# Patient Record
Sex: Female | Born: 2000 | Race: White | Hispanic: No | Marital: Single | Smoking: Never smoker
Health system: Southern US, Community
[De-identification: ages and names within clinical notes are randomized; demographics above are authoritative.]

## PROBLEM LIST (undated history)

## (undated) DIAGNOSIS — F32A Depression, unspecified: Secondary | ICD-10-CM

## (undated) DIAGNOSIS — F419 Anxiety disorder, unspecified: Secondary | ICD-10-CM

## (undated) DIAGNOSIS — G43909 Migraine, unspecified, not intractable, without status migrainosus: Secondary | ICD-10-CM

## (undated) HISTORY — DX: Anxiety disorder, unspecified: F41.9

## (undated) HISTORY — DX: Depression, unspecified: F32.A

---
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
## 2021-01-24 ENCOUNTER — Encounter: Payer: Self-pay | Admitting: Pediatrics

## 2021-01-24 ENCOUNTER — Other Ambulatory Visit: Payer: Self-pay

## 2021-01-24 ENCOUNTER — Ambulatory Visit (INDEPENDENT_AMBULATORY_CARE_PROVIDER_SITE_OTHER): Payer: Medicaid Other | Admitting: Pediatrics

## 2021-01-24 DIAGNOSIS — Z23 Encounter for immunization: Secondary | ICD-10-CM | POA: Diagnosis not present

## 2021-01-24 NOTE — Progress Notes (Signed)
   Chief Complaint  Patient presents with   Immunizations    Accompanied by self     Orders Placed This Encounter  Procedures   Meningococcal B, OMV (Bexsero)     Diagnosis:  Encounter for Vaccines (Z23) Handout (VIS) provided for each vaccine at this visit. Questions were answered. Parent verbally expressed understanding and also agreed with the administration of vaccine/vaccines as ordered above today.

## 2021-02-23 ENCOUNTER — Ambulatory Visit
Admission: EM | Admit: 2021-02-23 | Discharge: 2021-02-23 | Disposition: A | Discharge status: Home / Self Care | Payer: Medicaid Other | Attending: Internal Medicine | Admitting: Internal Medicine

## 2021-02-23 ENCOUNTER — Other Ambulatory Visit: Payer: Self-pay

## 2021-02-23 ENCOUNTER — Encounter: Payer: Self-pay | Admitting: Emergency Medicine

## 2021-02-23 DIAGNOSIS — J209 Acute bronchitis, unspecified: Secondary | ICD-10-CM | POA: Diagnosis not present

## 2021-02-23 MED ORDER — BENZONATATE 100 MG PO CAPS: 100.0000 mg | ORAL_CAPSULE | Freq: Three times a day (TID) | ORAL | 0 refills | Status: DC

## 2021-02-23 MED ORDER — CETIRIZINE HCL 10 MG PO TABS: 10.0000 mg | ORAL_TABLET | Freq: Every day | ORAL | Status: DC

## 2021-02-23 MED ORDER — ALBUTEROL SULFATE HFA 108 (90 BASE) MCG/ACT IN AERS: 1.0000 | INHALATION_SPRAY | Freq: Four times a day (QID) | RESPIRATORY_TRACT | 0 refills | Status: DC | PRN

## 2021-02-23 NOTE — ED Provider Notes (Signed)
[facility]    CSN: [id number] Arrival date & time: 02/23/21  0805      History   Chief Complaint Chief Complaint  Patient presents with   Cough   Sore Throat    HPI Emily [name] is a 20 y.o. female comes to the urgent care with a 2-week history of sore throat, and a cough productive of greenish sputum.  Patient says symptoms started 2 weeks ago and has been persistent.  Sore throat is worse in the mornings.  She admits having some postnasal drip.  She has had some headaches, nausea and fatigue.  She denies any vomiting or diarrhea.  Patient had some family members who tested positive for COVID couple of weeks ago.  She tested negative using home COVID test kit.  No shortness of breath.  She has some wheezing intermittently.  No chest pain or chest pressure.  Patient denies any seasonal allergies.  She is complaining of some itchy eyes over the past couple weeks.  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 No pertinent surgical history.  OB History   No obstetric history on file.      Home Medications    Prior to Admission medications   Medication Sig Start Date End Date Taking? Authorizing Provider  albuterol (VENTOLIN HFA) 108 (90 Base) MCG/ACT inhaler Inhale 1-2 puffs into the lungs every 6 (six) hours as needed for wheezing or shortness of breath. 02/23/21  Yes Jerrol Helmers, Myrene Galas, MD  benzonatate (TESSALON) 100 MG capsule Take 1 capsule (100 mg total) by mouth every 8 (eight) hours. 02/23/21  Yes Eugean Arnott, Myrene Galas, MD  cetirizine (ZYRTEC ALLERGY) 10 MG tablet Take 1 tablet (10 mg total) by mouth daily. 02/23/21  Yes Karianna Gusman,  Myrene Galas, MD  escitalopram (LEXAPRO) 10 MG tablet TAKE (1) TABLET BY MOUTH AT BEDTIME. 10/26/20  Yes Salvador, Vivian, DO  Multiple Vitamins-Minerals (MULTIVITAMIN ADULTS PO) Take by mouth.   Yes [provider]  Vitamin D, Ergocalciferol, (DRISDOL) 1.25 MG (50000 UNIT) CAPS capsule Take 1 capsule (50,000 Units total) by mouth every 7 (seven) days. 10/26/20   Iven Finn, DO    Family History History reviewed. No pertinent family history.  Social History Social History   Tobacco Use   Smoking status: Passive Smoke Exposure - Never Smoker   Smokeless tobacco: Never  Vaping Use   Vaping Use: Never used  Substance Use Topics   Alcohol use: Never   Drug use: Never     Allergies   Patient has no known allergies.   Review of Systems Review of Systems As per HPI  Physical Exam Triage Vital Signs ED Triage Vitals  Enc Vitals Group     BP 02/23/21 0814 128/85     Pulse Rate 02/23/21 0814 (!) 106     Resp 02/23/21 0814 18     Temp 02/23/21 0814 98.2 F (36.8 C)     Temp Source 02/23/21 0814 Oral     SpO2 02/23/21 0814 97 %     Weight --      Height --  Head Circumference --      Peak Flow --      Pain Score 02/23/21 0812 4     Pain Loc --      Pain Edu? --      Excl. in [place]? --    No data found.  Updated Vital Signs BP 128/85 (BP Location: Right Arm)   Pulse (!) 106   Temp 98.2 F (36.8 C) (Oral)   Resp 18   LMP 02/22/2021 (Exact Date)   SpO2 97%   Visual Acuity Right Eye Distance:   Left Eye Distance:   Bilateral Distance:    Right Eye Near:   Left Eye Near:    Bilateral Near:     Physical Exam Vitals and nursing note reviewed.  Constitutional:      General: She is not in acute distress.    Appearance: She is not ill-appearing.  HENT:     Right Ear: Tympanic membrane normal.     Left Ear: Tympanic membrane normal.     Nose: Congestion present. No rhinorrhea.     Mouth/Throat:     Mouth: Mucous membranes are moist.     Pharynx: No  posterior oropharyngeal erythema.  Cardiovascular:     Rate and Rhythm: Normal rate and regular rhythm.  Pulmonary:     Effort: Pulmonary effort is normal.     Breath sounds: Normal breath sounds. No wheezing or rhonchi.  Neurological:     Mental Status: She is alert.     UC Treatments / Results  Labs (all labs ordered are listed, but only abnormal results are displayed) Labs Reviewed - No data to display  EKG   Radiology No results found.  Procedures Procedures (including critical care time)  Medications Ordered in UC Medications - No data to display  Initial Impression / Assessment and Plan / UC Course  I have reviewed the triage vital signs and the nursing notes.  Pertinent labs & imaging results that were available during my care of the patient were reviewed by me and considered in my medical decision making (see chart for details).     1.  Acute bronchitis with bronchospasm: Albuterol inhaler as needed for wheezing Tessalon Perles as needed for cough Cetirizine as needed for itchy eyes Return to urgent care if symptoms worsen Your lung exam was unimpressive-no indication for chest x-ray at this time. Final Clinical Impressions(s) / UC Diagnoses   Final diagnoses:  Acute bronchitis with bronchospasm     Discharge Instructions      Please take medications as prescribed If symptoms worsen please return to urgent care to be reevaluated.   ED Prescriptions     Medication Sig Dispense Auth. Provider   albuterol (VENTOLIN HFA) 108 (90 Base) MCG/ACT inhaler Inhale 1-2 puffs into the lungs every 6 (six) hours as needed for wheezing or shortness of breath. 18 g Melquisedec Journey, Myrene Galas, MD   benzonatate (TESSALON) 100 MG capsule Take 1 capsule (100 mg total) by mouth every 8 (eight) hours. 21 capsule Thersea Manfredonia, Myrene Galas, MD   benzonatate (TESSALON) 100 MG capsule  (Status: Discontinued) Take 1 capsule (100 mg total) by mouth every 8 (eight) hours. 21 capsule Andrae Claunch,  Myrene Galas, MD   cetirizine (ZYRTEC ALLERGY) 10 MG tablet Take 1 tablet (10 mg total) by mouth daily. -- Chase Picket, MD      PDMP not reviewed this encounter.   Chase Picket, MD 02/23/21 [phone]

## 2021-02-23 NOTE — Discharge Instructions (Addendum)
Please take medications as prescribed If symptoms worsen please return to urgent care to be reevaluated.

## 2021-02-23 NOTE — ED Triage Notes (Signed)
Patient c/o productive cough w/ "green" sputum and sore throat x 2 weeks.   Patient endorses worsening sore throat in the morning, patient states " it's better some days and worst other days".   Patient denies fever at home.   Patient endorses nasal congestion, headache, nausea, and fatigue.   Patient reports taking an at home COVID test with negative results.   Patient has taken Mucinex w/ some relief of coughing.

## 2021-05-28 DIAGNOSIS — E669 Obesity, unspecified: Secondary | ICD-10-CM | POA: Diagnosis not present

## 2021-05-28 DIAGNOSIS — Z0000 Encounter for general adult medical examination without abnormal findings: Secondary | ICD-10-CM | POA: Diagnosis not present

## 2021-06-09 ENCOUNTER — Other Ambulatory Visit: Payer: Self-pay

## 2021-06-09 ENCOUNTER — Encounter: Payer: Self-pay | Admitting: *Deleted

## 2021-06-09 ENCOUNTER — Ambulatory Visit
Admission: EM | Admit: 2021-06-09 | Discharge: 2021-06-09 | Disposition: A | Discharge status: Home / Self Care | Payer: Medicaid Other | Attending: Family Medicine | Admitting: Family Medicine

## 2021-06-09 DIAGNOSIS — R112 Nausea with vomiting, unspecified: Secondary | ICD-10-CM

## 2021-06-09 DIAGNOSIS — G43009 Migraine without aura, not intractable, without status migrainosus: Secondary | ICD-10-CM

## 2021-06-09 HISTORY — DX: Migraine, unspecified, not intractable, without status migrainosus: G43.909

## 2021-06-09 MED ORDER — ONDANSETRON 4 MG PO TBDP: 4.0000 mg | ORAL_TABLET | Freq: Three times a day (TID) | ORAL | 0 refills | Status: DC | PRN

## 2021-06-09 MED ORDER — DEXAMETHASONE SODIUM PHOSPHATE 10 MG/ML IJ SOLN
10.0000 mg | Freq: Once | INTRAMUSCULAR | Status: AC
  Administered 2021-06-09: 16:00:00 10 mg via INTRAMUSCULAR

## 2021-06-09 MED ORDER — SUMATRIPTAN SUCCINATE 50 MG PO TABS: ORAL_TABLET | ORAL | 0 refills | Status: DC

## 2021-06-09 NOTE — ED Triage Notes (Signed)
Pt states started with her "typical" migraine this AM upon waking.  Has taken OTC acetaminophen/caffeine/ASA med this AM without any improvement. C/O sensitivity to light and smell (states normal for her migraines), nausea.

## 2021-06-13 NOTE — ED Provider Notes (Signed)
[facility]    CSN: [id number] Arrival date & time: 06/09/21  1401      History   Chief Complaint Chief Complaint  Patient presents with   Migraine    HPI Emily [name] is a 20 y.o. female.   Patient presenting today with new onset migraine since waking this morning.  States that she gets migraines frequently and this 1 is very typical for her.  Light sensitivity, smell sensitivity, nausea but no vomiting.  Has tried her typical over-the-counter acetaminophen and Excedrin with no relief.  States she has been dealing with migraines for quite some time, typically injectable medications help more than over-the-counter.  Denies dizziness, head injury, fever, chills.   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 No pertinent surgical history.  OB History   No obstetric history on file.      Home Medications    Prior to Admission medications   Medication Sig Start Date End Date Taking? Authorizing Provider  escitalopram (LEXAPRO) 10 MG tablet TAKE (1) TABLET BY MOUTH AT BEDTIME. 10/26/20  Yes Salvador, Vivian, DO  Multiple Vitamins-Minerals (MULTIVITAMIN ADULTS PO) Take by mouth.   Yes [provider]  ondansetron (ZOFRAN-ODT) 4 MG disintegrating tablet Take 1 tablet (4 mg total) by mouth every 8 (eight) hours as needed for nausea or vomiting. 06/09/21  Yes Particia Nearing, PA-C  SUMAtriptan (IMITREX) 50 MG tablet Take 1 tab at onset of migraine. May repeat in 2 hours if headache persists or recurs. Max of 2 tabs daily 06/09/21  Yes Particia Nearing, PA-C  Vitamin D, Ergocalciferol, (DRISDOL) 1.25  MG (50000 UNIT) CAPS capsule Take 1 capsule (50,000 Units total) by mouth every 7 (seven) days. 10/26/20  Yes Salvador, Vivian, DO  albuterol (VENTOLIN HFA) 108 (90 Base) MCG/ACT inhaler Inhale 1-2 puffs into the lungs every 6 (six) hours as needed for wheezing or shortness of breath. 02/23/21   Lamptey, Britta Mccreedy, MD  benzonatate (TESSALON) 100 MG capsule Take 1 capsule (100 mg total) by mouth every 8 (eight) hours. 02/23/21   Merrilee Jansky, MD  cetirizine (ZYRTEC ALLERGY) 10 MG tablet Take 1 tablet (10 mg total) by mouth daily. 02/23/21   Lamptey, Britta Mccreedy, MD    Family History Family History  Problem Relation Age of Onset   Healthy Mother    Healthy Father     Social History Social History   Tobacco Use   Smoking status: Never    Passive exposure: Yes   Smokeless tobacco: Never  Vaping Use   Vaping Use: Never used  Substance Use Topics   Alcohol use: Never   Drug use: Never     Allergies   Patient has no known allergies.   Review of Systems Review of Systems Per HPI  Physical Exam Triage Vital Signs ED Triage Vitals [06/09/21 1529]  Enc Vitals Group     BP 129/86     Pulse Rate 95     Resp 16     Temp 97.8 F (36.6 C)     Temp Source Temporal  SpO2 98 %     Weight      Height      Head Circumference      Peak Flow      Pain Score 10     Pain Loc      Pain Edu?      Excl. in [place]?    No data found.  Updated Vital Signs BP 129/86    Pulse 95    Temp 97.8 F (36.6 C) (Temporal)    Resp 16    LMP 06/02/2021 (Approximate)    SpO2 98%   Visual Acuity Right Eye Distance:   Left Eye Distance:   Bilateral Distance:    Right Eye Near:   Left Eye Near:    Bilateral Near:     Physical Exam Vitals and nursing note reviewed.  Constitutional:      Appearance: Normal appearance. She is not ill-appearing.  HENT:     Head: Atraumatic.     Mouth/Throat:     Mouth: Mucous membranes are moist.  Eyes:     Extraocular Movements: Extraocular movements intact.      Conjunctiva/sclera: Conjunctivae normal.  Cardiovascular:     Rate and Rhythm: Normal rate and regular rhythm.     Heart sounds: Normal heart sounds.  Pulmonary:     Effort: Pulmonary effort is normal.     Breath sounds: Normal breath sounds.  Abdominal:     General: Bowel sounds are normal. There is no distension.     Palpations: Abdomen is soft.     Tenderness: There is no abdominal tenderness. There is no guarding.  Musculoskeletal:        General: Normal range of motion.     Cervical back: Normal range of motion and neck supple.  Skin:    General: Skin is warm and dry.  Neurological:     General: No focal deficit present.     Mental Status: She is alert and oriented to person, place, and time.     Motor: No weakness.     Gait: Gait normal.  Psychiatric:        Mood and Affect: Mood normal.        Thought Content: Thought content normal.        Judgment: Judgment normal.     UC Treatments / Results  Labs (all labs ordered are listed, but only abnormal results are displayed) Labs Reviewed - No data to display  EKG   Radiology No results found.  Procedures Procedures (including critical care time)  Medications Ordered in UC Medications  dexamethasone (DECADRON) injection 10 mg (10 mg Intramuscular Given 06/09/21 1559)    Initial Impression / Assessment and Plan / UC Course  I have reviewed the triage vital signs and the nursing notes.  Pertinent labs & imaging results that were available during my care of the patient were reviewed by me and considered in my medical decision making (see chart for details).     Vital signs reassuring, no focal neurologic deficits on exam.  IM Decadron given in addition to continued over-the-counter pain relievers, rest, hydration.  We will also send Zofran and Imitrex to try as needed.  PCP follow-up recommended.  Return for acutely worsening symptoms.  Final Clinical Impressions(s) / UC Diagnoses   Final diagnoses:   Migraine without aura and without status migrainosus, not intractable  Nausea and vomiting, unspecified vomiting type   Discharge Instructions   None    ED Prescriptions  Medication Sig Dispense Auth. Provider   ondansetron (ZOFRAN-ODT) 4 MG disintegrating tablet Take 1 tablet (4 mg total) by mouth every 8 (eight) hours as needed for nausea or vomiting. 20 tablet Particia Nearing, [place]   SUMAtriptan (IMITREX) 50 MG tablet Take 1 tab at onset of migraine. May repeat in 2 hours if headache persists or recurs. Max of 2 tabs daily 10 tablet Particia Nearing, [place]      PDMP not reviewed this encounter.   Particia Nearing, [place] 06/13/21 [phone]

## 2021-07-17 DIAGNOSIS — Z30017 Encounter for initial prescription of implantable subdermal contraceptive: Secondary | ICD-10-CM | POA: Diagnosis not present

## 2021-08-09 IMAGING — US US BREAST*L* LIMITED INC AXILLA
1 series · 8 of 8 positions shown · non-contrast
Comparison: None.

CLINICAL DATA: 19-year-old female with a palpable area of concern
in the far outer left breast.

EXAM:
ULTRASOUND OF THE LEFT BREAST

[Series 1: us breast*left* limited inc axilla · 0.07mm/px · 8 of 8 slices shown]
[im 1/8]
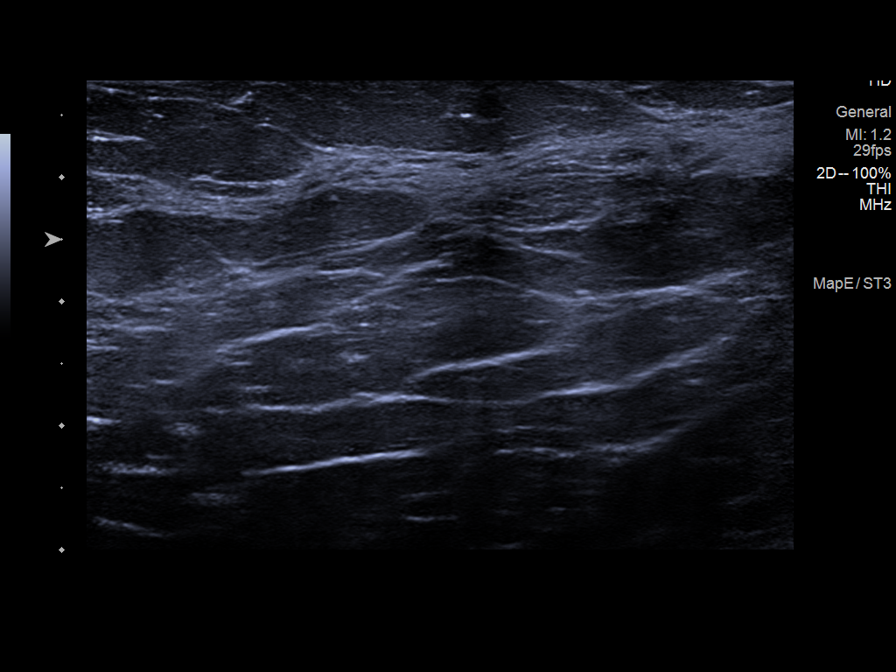
[im 2/8]
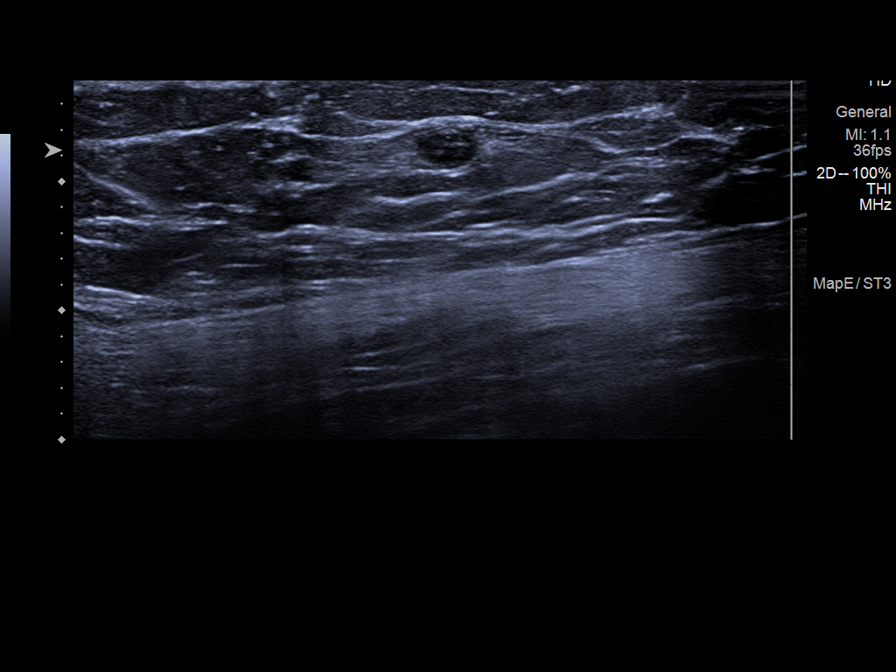
[im 3/8]
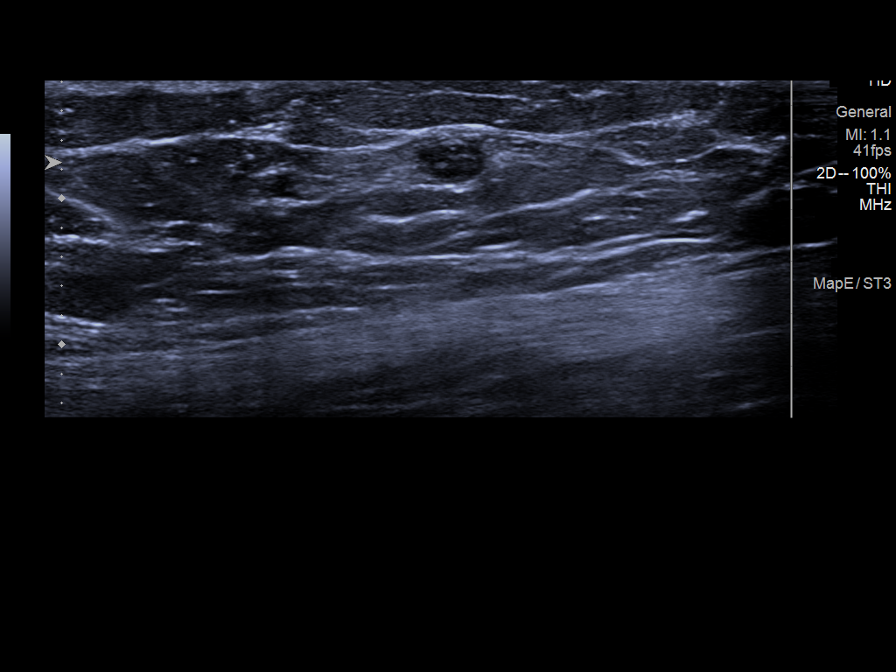
[im 4/8]
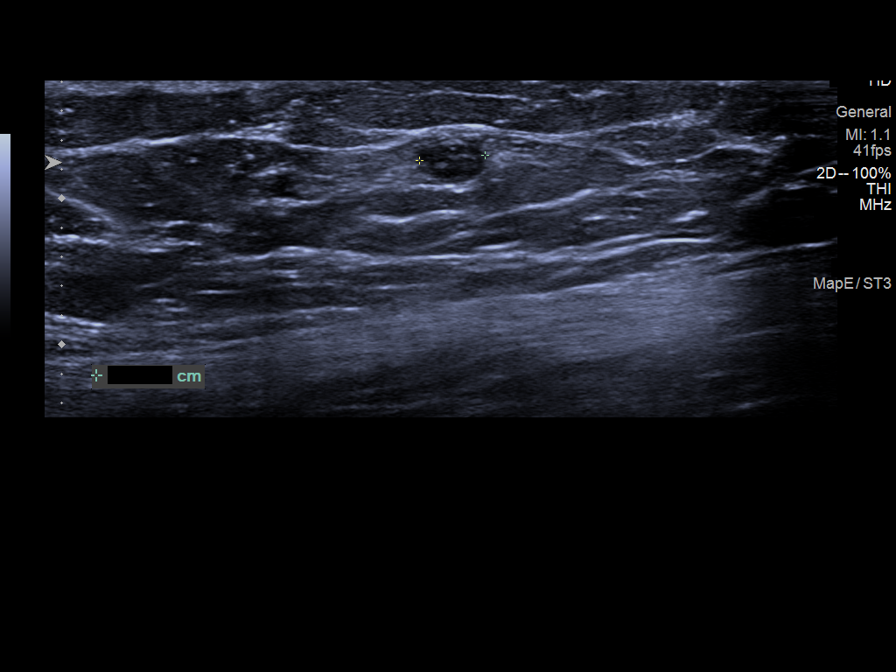
[im 5/8]
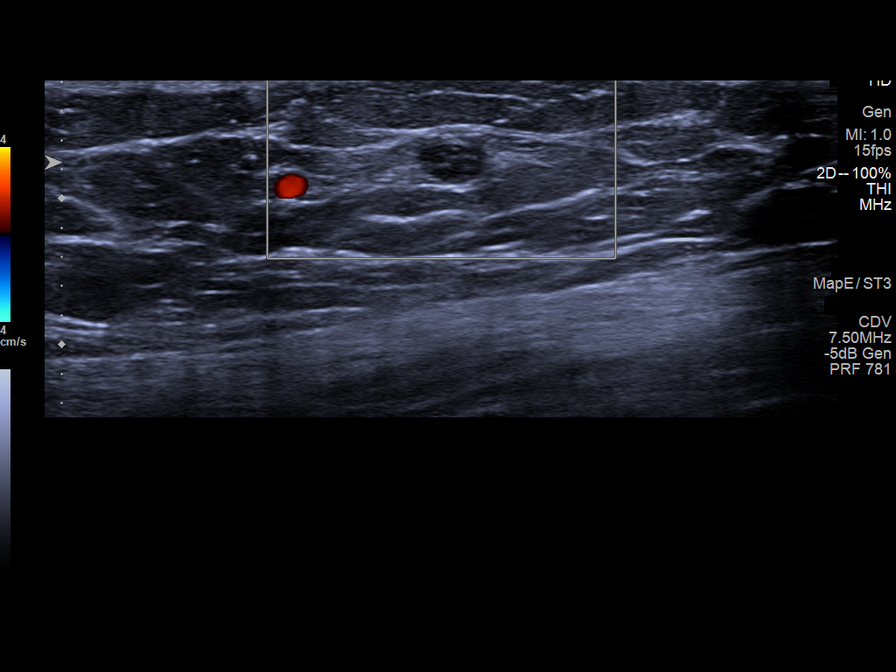
[im 6/8]
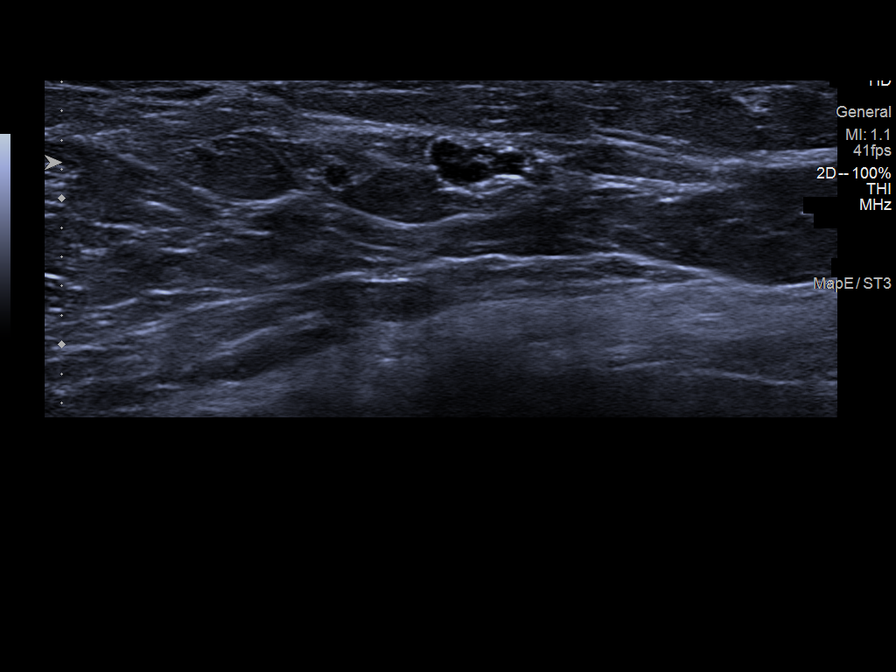
[im 7/8]
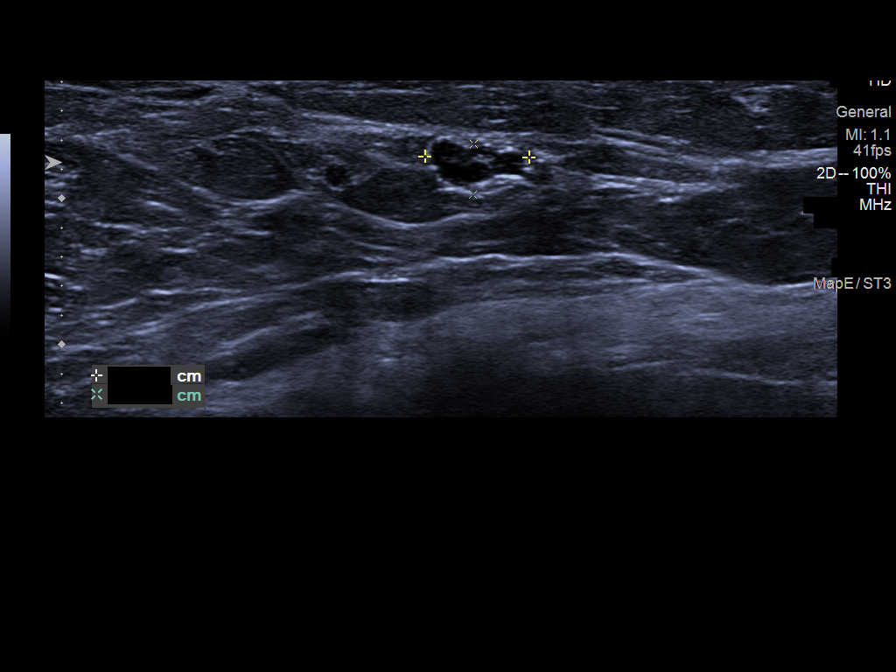
[im 8/8]
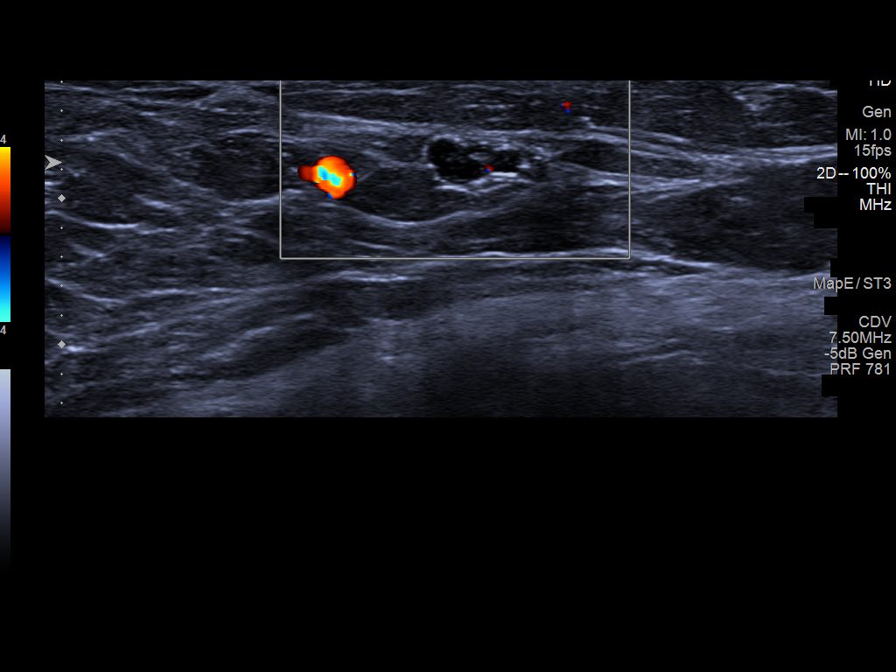

[8 of 8 positions shown; findings below may reference images not displayed]

FINDINGS: Physical examination of the far outer left breast reveals a small
slightly mobile palpable mass.

Targeted ultrasound of the left breast was performed. There is a
small intramammary lymph node at 1 o'clock 11 cm from nipple
measuring 0.5 x 0.4 x 0.7 cm. No suspicious masses or abnormality
seen in the region of concern in the outer left breast.
IMPRESSION: Benign appearing intramammary lymph node at site of palpable concern
in the outer left breast.

RECOMMENDATION:
Screening mammogram at age 41 unless there are persistent or
intervening clinical concerns. (Code:11-B-0S5)

I have discussed the findings and recommendations with the patient.
If applicable, a reminder letter will be sent to the patient
regarding the next appointment.

BI-RADS CATEGORY  2: Benign.

## 2021-08-29 ENCOUNTER — Encounter: Payer: Self-pay | Admitting: Internal Medicine

## 2021-08-29 ENCOUNTER — Other Ambulatory Visit: Payer: Self-pay

## 2021-08-29 ENCOUNTER — Ambulatory Visit (INDEPENDENT_AMBULATORY_CARE_PROVIDER_SITE_OTHER): Payer: Medicaid Other | Admitting: Internal Medicine

## 2021-08-29 VITALS — BP 138/88 | HR 102 | Resp 16 | Ht 66.0 in | Wt 279.0 lb

## 2021-08-29 DIAGNOSIS — G43719 Chronic migraine without aura, intractable, without status migrainosus: Secondary | ICD-10-CM | POA: Insufficient documentation

## 2021-08-29 DIAGNOSIS — J302 Other seasonal allergic rhinitis: Secondary | ICD-10-CM | POA: Diagnosis not present

## 2021-08-29 DIAGNOSIS — F411 Generalized anxiety disorder: Secondary | ICD-10-CM | POA: Diagnosis not present

## 2021-08-29 DIAGNOSIS — N926 Irregular menstruation, unspecified: Secondary | ICD-10-CM | POA: Insufficient documentation

## 2021-08-29 MED ORDER — ESCITALOPRAM OXALATE 10 MG PO TABS: 10.0000 mg | ORAL_TABLET | Freq: Every day | ORAL | 3 refills | Status: DC

## 2021-08-29 MED ORDER — RIZATRIPTAN BENZOATE 10 MG PO TABS: 10.0000 mg | ORAL_TABLET | ORAL | 2 refills | Status: DC | PRN

## 2021-08-29 NOTE — Assessment & Plan Note (Signed)
Likely due to recent Nexplanon insertion ?Advised to contact OB/GYN if persistent concern ?

## 2021-08-29 NOTE — Assessment & Plan Note (Signed)
Advised to follow low-carb diet and perform moderate exercise at least 150 minutes/week 

## 2021-08-29 NOTE — Assessment & Plan Note (Signed)
Was given Imitrex, but did not help much ?Will give a trial of Maxalt ?If she requires frequent use of Maxalt, will start prophylactic treatment ?

## 2021-08-29 NOTE — Assessment & Plan Note (Signed)
Well controlled with Claritin currently ?

## 2021-08-29 NOTE — Patient Instructions (Signed)
Please start taking Maxalt as needed for migraine. ? ?Please continue to take Lexapro as prescribed. ? ?Please take Vitamin D 2000 IU once daily in addition to multivitamin supplement. ?

## 2021-08-29 NOTE — Assessment & Plan Note (Signed)
Well controlled with Lexapro currently, refilled 

## 2021-08-29 NOTE — Progress Notes (Signed)
? ?New Patient Office Visit ? ?Subjective:  ?Patient ID: Emily [name], female    DOB: [birth year]  Age: 21 y.o. MRN: [id number] ? ?CC:  ?Chief Complaint  ?Patient presents with  ? New Patient (Initial Visit)  ?  New patient was being seen at [facility]. Pt has been having bad migraines since she was a child and would like to have this check also may need refills on anxiety meds  ? ? ?HPI ?Emily [name] is a 21 y.o. female with past medical history of GAD, migraine, morbid obesity and seasonal allergies who presents for establishing care. ? ?GAD: Currently well controlled with Lexapro.  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  She appears to have social anxiety as well.  Her mother is present during the visit today.  She currently denies any anhedonia, SI or HI. ? ?She also complains of intermittent headache, which is unilateral, lasts for few hours to a day and is associated with photophobia.  Her headache is triggered by certain smell and light.  She denies watery eyes. ? ?She takes Claritin for allergies. ? ?She complains of irregular menstrual cycles since she has had Nexplanon.  She has seen [facility] and had Nexplanon placed in 01/23.  She denies any vaginal discharge currently.  Denies any dysuria or hematuria currently. ? ?She has not had COVID or flu vaccines. ? ?Past Medical History:  ?Diagnosis Date  ? Anxiety   ? Phreesia 03/29/2020  ? Depression   ? Phreesia 03/29/2020  ? Febrile seizure ([facility]) 12/2003  ? Migraines   ? UTI (urinary tract infection) 12/2003  ? Renal US WNL  ? ? ?History reviewed. No pertinent surgical history. ? ?Family History  ?Problem Relation Age of Onset  ? Healthy Mother   ? Healthy Father   ? ? ?Social History  ? ?Socioeconomic History  ? Marital status: Single  ?  Spouse name: Not on file  ? Number of children: Not on file  ? Years of education: Not on file  ? Highest education level: Not on file  ?Occupational History  ? Not on file  ?Tobacco Use  ?  Smoking status: Never  ?  Passive exposure: Yes  ? Smokeless tobacco: Never  ?Vaping Use  ? Vaping Use: Never used  ?Substance and Sexual Activity  ? Alcohol use: Never  ? Drug use: Never  ? Sexual activity: Never  ?  Partners: Female  ?Other Topics Concern  ? Not on file  ?Social History Narrative  ? Not on file  ? ?Social Determinants of Health  ? ?Financial Resource Strain: Not on file  ?Food Insecurity: Not on file  ?Transportation Needs: Not on file  ?Physical Activity: Not on file  ?Stress: Not on file  ?Social Connections: Not on file  ?Intimate Partner Violence: Not on file  ? ? ?ROS ?Review of Systems  ?Constitutional:  Negative for chills and fever.  ?HENT:  Negative for congestion, sinus pressure, sinus pain and sore throat.   ?Eyes:  Negative for pain and discharge.  ?Respiratory:  Negative for cough and shortness of breath.   ?Cardiovascular:  Negative for chest pain and palpitations.  ?Gastrointestinal:  Negative for abdominal pain, constipation, diarrhea, nausea and vomiting.  ?Endocrine: Negative for polydipsia and polyuria.  ?Genitourinary:  Positive for menstrual problem. Negative for dysuria and hematuria.  ?Musculoskeletal:  Negative for neck pain and neck stiffness.  ?Skin:  Negative for rash.  ?Neurological:  Positive for headaches. Negative for  dizziness and weakness.  ?Psychiatric/Behavioral:  Negative for agitation, behavioral problems and dysphoric mood. The patient is nervous/anxious.   ? ?Objective:  ? ?Today's Vitals: BP 138/88 (BP Location: Left Arm, Patient Position: Sitting, Cuff Size: Normal)   Pulse (!) 102   Resp 16   Ht 5\' 6"  (1.676 m)   Wt 279 lb (126.6 kg)   SpO2 100%   BMI 45.03 kg/m?  ? ?Physical Exam ?Vitals reviewed.  ?Constitutional:   ?   General: She is not in acute distress. ?   Appearance: She is obese. She is not diaphoretic.  ?HENT:  ?   Head: Normocephalic and atraumatic.  ?   Nose: Nose normal.  ?   Mouth/Throat:  ?   Mouth: Mucous membranes are moist.  ?Eyes:   ?   General: No scleral icterus. ?   Extraocular Movements: Extraocular movements intact.  ?Cardiovascular:  ?   Rate and Rhythm: Normal rate and regular rhythm.  ?   Pulses: Normal pulses.  ?   Heart sounds: Normal heart sounds. No murmur heard. ?Pulmonary:  ?   Breath sounds: Normal breath sounds. No wheezing or rales.  ?Abdominal:  ?   Palpations: Abdomen is soft.  ?   Tenderness: There is no abdominal tenderness.  ?Musculoskeletal:  ?   Cervical back: Neck supple. No tenderness.  ?   Right lower leg: No edema.  ?   Left lower leg: No edema.  ?Skin: ?   General: Skin is warm.  ?   Findings: No rash.  ?Neurological:  ?   General: No focal deficit present.  ?   Mental Status: She is alert and oriented to person, place, and time.  ?Psychiatric:     ?   Mood and Affect: Mood is anxious.     ?   Behavior: Behavior normal.  ? ? ?Assessment & Plan:  ? ?Problem List Items Addressed This Visit   ? ?  ? Cardiovascular and Mediastinum  ? Intractable chronic migraine without aura and without status migrainosus - Primary  ?  Was given Imitrex, but did not help much ?Will give a trial of Maxalt ?If she requires frequent use of Maxalt, will start prophylactic treatment ?  ?  ? Relevant Medications  ? rizatriptan (MAXALT) 10 MG tablet  ? escitalopram (LEXAPRO) 10 MG tablet  ?  ? Other  ? Generalized anxiety disorder  ?  Well controlled with Lexapro currently, refilled ?  ?  ? Relevant Medications  ? escitalopram (LEXAPRO) 10 MG tablet  ? Morbid obesity (HCC)  ?  Advised to follow low-carb diet and perform moderate exercise at least 150 minutes/week ?  ?  ? Irregular menstrual bleeding  ?  Likely due to recent Nexplanon insertion ?Advised to contact OB/GYN if persistent concern ?  ?  ? Seasonal allergies  ?  Well controlled with Claritin currently ?  ?  ? ? ?Outpatient Encounter Medications as of 08/29/2021  ?Medication Sig  ? loratadine (CLARITIN) 10 MG tablet Take 10 mg by mouth daily.  ? Multiple Vitamins-Minerals (MULTIVITAMIN  ADULTS PO) Take by mouth.  ? NEXPLANON 68 MG IMPL implant   ? rizatriptan (MAXALT) 10 MG tablet Take 1 tablet (10 mg total) by mouth as needed for migraine. May repeat in 2 hours if needed. Maximum 2 per day.  ? [DISCONTINUED] escitalopram (LEXAPRO) 10 MG tablet TAKE (1) TABLET BY MOUTH AT BEDTIME.  ? escitalopram (LEXAPRO) 10 MG tablet Take 1 tablet (10 mg total) by  mouth at bedtime.  ? [DISCONTINUED] albuterol (VENTOLIN HFA) 108 (90 Base) MCG/ACT inhaler Inhale 1-2 puffs into the lungs every 6 (six) hours as needed for wheezing or shortness of breath. (Patient not taking: Reported on 08/29/2021)  ? [DISCONTINUED] benzonatate (TESSALON) 100 MG capsule Take 1 capsule (100 mg total) by mouth every 8 (eight) hours. (Patient not taking: Reported on 08/29/2021)  ? [DISCONTINUED] cetirizine (ZYRTEC ALLERGY) 10 MG tablet Take 1 tablet (10 mg total) by mouth daily. (Patient not taking: Reported on 08/29/2021)  ? [DISCONTINUED] ondansetron (ZOFRAN-ODT) 4 MG disintegrating tablet Take 1 tablet (4 mg total) by mouth every 8 (eight) hours as needed for nausea or vomiting. (Patient not taking: Reported on 08/29/2021)  ? [DISCONTINUED] SUMAtriptan (IMITREX) 50 MG tablet Take 1 tab at onset of migraine. May repeat in 2 hours if headache persists or recurs. Max of 2 tabs daily (Patient not taking: Reported on 08/29/2021)  ? [DISCONTINUED] Vitamin D, Ergocalciferol, (DRISDOL) 1.25 MG (50000 UNIT) CAPS capsule Take 1 capsule (50,000 Units total) by mouth every 7 (seven) days. (Patient not taking: Reported on 08/29/2021)  ? ?No facility-administered encounter medications on file as of 08/29/2021.  ? ? ?Follow-up: Return in about 6 months (around 03/01/2022) for Annual physical.  ? ?Anabel Halon, MD ?

## 2021-10-07 ENCOUNTER — Ambulatory Visit
Admission: EM | Admit: 2021-10-07 | Discharge: 2021-10-07 | Disposition: A | Discharge status: Home / Self Care | Payer: Medicaid Other | Attending: Family Medicine | Admitting: Family Medicine

## 2021-10-07 DIAGNOSIS — R112 Nausea with vomiting, unspecified: Secondary | ICD-10-CM | POA: Diagnosis not present

## 2021-10-07 DIAGNOSIS — R21 Rash and other nonspecific skin eruption: Secondary | ICD-10-CM

## 2021-10-07 DIAGNOSIS — R197 Diarrhea, unspecified: Secondary | ICD-10-CM | POA: Diagnosis not present

## 2021-10-07 MED ORDER — PREDNISONE 20 MG PO TABS: 40.0000 mg | ORAL_TABLET | Freq: Every day | ORAL | 0 refills | Status: DC

## 2021-10-07 NOTE — ED Provider Notes (Signed)
?[facility] ? ? ? ?CSN: [id number] ?Arrival date & time: 10/07/21  1026 ? ? ?  ? ?History   ?Chief Complaint ?Chief Complaint  ?Patient presents with  ? Diarrhea  ? Emesis  ? Rash  ? ? ?HPI ?Emily [name] is a 21 y.o. female.  ? ?Presenting today with 3-day history of nausea, vomiting, diarrhea that is now resolving well and she is tolerating p.o.  States now she is developing a red diffuse rash on her face.  She states she gets this from time to time but never to this extent, usually just around the eyes and eyelid area.  Denies itching, pain, new soaps or hygiene products, new foods, new medications, new outdoor exposures.  She is not having any chest tightness, shortness of breath, throat swelling or itching, nausea, vomiting, fevers.  Not trying anything for the rash thus far.  She states it usually goes away on its own. ? ?Past Medical History:  ?Diagnosis Date  ? Anxiety   ? Phreesia 03/29/2020  ? Depression   ? Phreesia 03/29/2020  ? Febrile seizure (HCC) 12/2003  ? Migraines   ? UTI (urinary tract infection) 12/2003  ? Renal US WNL  ? ?Patient Active Problem List  ? Diagnosis Date Noted  ? Intractable chronic migraine without aura and without status migrainosus 08/29/2021  ? Generalized anxiety disorder 08/29/2021  ? Morbid obesity (HCC) 08/29/2021  ? Irregular menstrual bleeding 08/29/2021  ? Seasonal allergies 08/29/2021  ? ?History reviewed. No pertinent surgical history. ? ?OB History   ?No obstetric history on file. ?  ? ?Home Medications   ? ?Prior to Admission medications   ?Medication Sig Start Date End Date Taking? Authorizing Provider  ?predniSONE (DELTASONE) 20 MG tablet Take 2 tablets (40 mg total) by mouth daily with breakfast. 10/07/21  Yes Particia NearingLane, Jahniyah Revere Elizabeth, PA-C  ?escitalopram (LEXAPRO) 10 MG tablet Take 1 tablet (10 mg total) by mouth at bedtime. 08/29/21   Anabel HalonPatel, Rutwik K, MD  ?loratadine (CLARITIN) 10 MG tablet Take 10 mg by mouth daily.    [provider]  ?Multiple  Vitamins-Minerals (MULTIVITAMIN ADULTS PO) Take by mouth.    [provider]  ?NEXPLANON 68 MG IMPL implant  06/04/21   [provider]  ?rizatriptan (MAXALT) 10 MG tablet Take 1 tablet (10 mg total) by mouth as needed for migraine. May repeat in 2 hours if needed. Maximum 2 per day. 08/29/21   Anabel HalonPatel, Rutwik K, MD  ? ?Family History ?Family History  ?Problem Relation Age of Onset  ? Healthy Mother   ? Healthy Father   ? ?Social History ?Social History  ? ?Tobacco Use  ? Smoking status: Never  ?  Passive exposure: Yes  ? Smokeless tobacco: Never  ?Vaping Use  ? Vaping Use: Never used  ?Substance Use Topics  ? Alcohol use: Never  ? Drug use: Never  ? ? ? ?Allergies   ?Patient has no known allergies. ? ? ?Review of Systems ?Review of Systems ?Per HPI ? ?Physical Exam ?Triage Vital Signs ?ED Triage Vitals  ?Enc Vitals Group  ?   BP 10/07/21 1115 115/75  ?   Pulse Rate 10/07/21 1115 93  ?   Resp 10/07/21 1115 18  ?   Temp 10/07/21 1115 98.6 ?F (37 ?C)  ?   Temp src --   ?   SpO2 10/07/21 1115 99 %  ?   Weight --   ?   Height --   ?  Head Circumference --   ?   Peak Flow --   ?   Pain Score 10/07/21 1113 0  ?   Pain Loc --   ?   Pain Edu? --   ?   Excl. in GC? --   ? ?No data found. ? ?Updated Vital Signs ?BP 115/75   Pulse 93   Temp 98.6 ?F (37 ?C)   Resp 18   SpO2 99%  ? ?Visual Acuity ?Right Eye Distance:   ?Left Eye Distance:   ?Bilateral Distance:   ? ?Right Eye Near:   ?Left Eye Near:    ?Bilateral Near:    ? ?Physical Exam ?Vitals and nursing note reviewed.  ?Constitutional:   ?   Appearance: Normal appearance. She is not ill-appearing.  ?HENT:  ?   Head: Atraumatic.  ?   Mouth/Throat:  ?   Mouth: Mucous membranes are moist.  ?   Pharynx: No oropharyngeal exudate or posterior oropharyngeal erythema.  ?Eyes:  ?   Extraocular Movements: Extraocular movements intact.  ?   Conjunctiva/sclera: Conjunctivae normal.  ?Cardiovascular:  ?   Rate and Rhythm: Normal rate and regular rhythm.  ?   Heart  sounds: Normal heart sounds.  ?Pulmonary:  ?   Effort: Pulmonary effort is normal.  ?   Breath sounds: Normal breath sounds. No wheezing or rales.  ?Abdominal:  ?   General: Bowel sounds are normal. There is no distension.  ?   Palpations: Abdomen is soft.  ?   Tenderness: There is no abdominal tenderness. There is no guarding.  ?Musculoskeletal:     ?   General: Normal range of motion.  ?   Cervical back: Normal range of motion and neck supple.  ?Skin: ?   General: Skin is warm and dry.  ?   Findings: Rash present.  ?   Comments: Diffuse erythematous rash to face extending to lower chin  ?Neurological:  ?   Mental Status: She is alert and oriented to person, place, and time.  ?Psychiatric:     ?   Mood and Affect: Mood normal.     ?   Thought Content: Thought content normal.     ?   Judgment: Judgment normal.  ? ? ? ?UC Treatments / Results  ?Labs ?(all labs ordered are listed, but only abnormal results are displayed) ?Labs Reviewed - No data to display ? ?EKG ? ? ?Radiology ?No results found. ? ?Procedures ?Procedures (including critical care time) ? ?Medications Ordered in UC ?Medications - No data to display ? ?Initial Impression / Assessment and Plan / UC Course  ?I have reviewed the triage vital signs and the nursing notes. ? ?Pertinent labs & imaging results that were available during my care of the patient were reviewed by me and considered in my medical decision making (see chart for details). ? ?  ? ?Vitals and exam very reassuring today, patient states her GI symptoms have all but resolved and no red flag findings on exam today.  Rash possibly irritant versus eczema, treat with a burst of prednisone, over-the-counter hydrocortisone cream mixed with a good unscented moisturizer.  Return for worsening symptoms. ? ?Final Clinical Impressions(s) / UC Diagnoses  ? ?Final diagnoses:  ?Facial rash  ?Nausea vomiting and diarrhea  ? ?Discharge Instructions   ?None ?  ? ?ED Prescriptions   ? ? Medication Sig  Dispense Auth. Provider  ? predniSONE (DELTASONE) 20 MG tablet Take 2 tablets (40 mg total) by mouth daily with  breakfast. 10 tablet Particia Nearing, [place]  ? ?  ? ?PDMP not reviewed this encounter. ?  ?Particia Nearing, PA-C ?10/07/21 1145 ? ?

## 2021-10-07 NOTE — ED Triage Notes (Signed)
Pt presents with c/o diarrhea and vomiting that began 3 days ago, resolved and now has rash on face  ?

## 2021-10-31 ENCOUNTER — Telehealth: Payer: Self-pay | Admitting: Neurology

## 2021-10-31 ENCOUNTER — Ambulatory Visit (INDEPENDENT_AMBULATORY_CARE_PROVIDER_SITE_OTHER): Payer: Medicaid Other | Admitting: Internal Medicine

## 2021-10-31 ENCOUNTER — Encounter: Payer: Self-pay | Admitting: Neurology

## 2021-10-31 ENCOUNTER — Encounter: Payer: Self-pay | Admitting: Internal Medicine

## 2021-10-31 DIAGNOSIS — G43719 Chronic migraine without aura, intractable, without status migrainosus: Secondary | ICD-10-CM | POA: Diagnosis not present

## 2021-10-31 MED ORDER — TOPIRAMATE 25 MG PO TABS: 25.0000 mg | ORAL_TABLET | Freq: Every day | ORAL | 2 refills | Status: DC

## 2021-10-31 NOTE — Patient Instructions (Signed)
Please start taking Topiramate as prescribed. ? ?Please take Maxalt as needed for breakthrough headache. ?

## 2021-10-31 NOTE — Progress Notes (Signed)
?  ? ?Virtual Visit via Telephone Note  ? ?This visit type was conducted due to national recommendations for restrictions regarding the COVID-19 Pandemic (e.g. social distancing) in an effort to limit this patient's exposure and mitigate transmission in our community.  Due to her co-morbid illnesses, this patient is at least at moderate risk for complications without adequate follow up.  This format is felt to be most appropriate for this patient at this time.  The patient did not have access to video technology/had technical difficulties with video requiring transitioning to audio format only (telephone).  All issues noted in this document were discussed and addressed.  No physical exam could be performed with this format. ? ?Evaluation Performed:  Follow-up visit ? ?Date:  10/31/2021  ? ?ID:  Emily [name], DOB 10/01/2000, MRN [id number] ? ?Patient Location: Home ?Provider Location: Office/Clinic ? ?Participants: Patient ?Location of Patient: Home ?Location of Provider: Telehealth ?Consent was obtain for visit to be over via telehealth. ?I verified that I am speaking with the correct person using two identifiers. ? ?PCP:  Anabel Halon, MD  ? ?Chief Complaint: Headache ? ?History of Present Illness:   ? ?Emily [name] is a 21 y.o. female who has a televisit for c/o persistent headache episodes, at least once every week, up to 5 episodes in a week. She complains of intermittent headache, which is unilateral, lasts for few hours to a day and is associated with photophobia.  Her headache is triggered by certain smell and light. She denies watery eyes. She has tried Sumatriptan, which was not effective. She was given Maxalt in the last visit, which helped initially, but she has more frequent headaches now. ? ?The patient does not have symptoms concerning for COVID-19 infection (fever, chills, cough, or new shortness of breath).  ? ?Past Medical, Surgical, Social History, Allergies, and Medications have been  Reviewed. ? ?Past Medical History:  ?Diagnosis Date  ? Anxiety   ? Phreesia 03/29/2020  ? Depression   ? Phreesia 03/29/2020  ? Febrile seizure (HCC) 12/2003  ? Migraines   ? UTI (urinary tract infection) 12/2003  ? Renal US WNL  ? ?History reviewed. No pertinent surgical history.  ? ?Current Meds  ?Medication Sig  ? escitalopram (LEXAPRO) 10 MG tablet Take 1 tablet (10 mg total) by mouth at bedtime.  ? loratadine (CLARITIN) 10 MG tablet Take 10 mg by mouth daily.  ? Multiple Vitamins-Minerals (MULTIVITAMIN ADULTS PO) Take by mouth.  ? NEXPLANON 68 MG IMPL implant   ? rizatriptan (MAXALT) 10 MG tablet Take 1 tablet (10 mg total) by mouth as needed for migraine. May repeat in 2 hours if needed. Maximum 2 per day.  ?  ? ?Allergies:   Patient has no known allergies.  ? ?ROS:   ?Please see the history of present illness.    ? ?All other systems reviewed and are negative. ? ? ?Labs/Other Tests and Data Reviewed:   ? ?Recent Labs: ?No results found for requested labs within last 8760 hours.  ? ?Recent Lipid Panel ?No results found for: CHOL, TRIG, HDL, CHOLHDL, LDLCALC, LDLDIRECT ? ?Wt Readings from Last 3 Encounters:  ?08/29/21 279 lb (126.6 kg) (>99 %, Z= 2.70)*  ?10/26/20 257 lb 3.2 oz (116.7 kg) (>99 %, Z= 2.51)*  ?09/11/20 255 lb 3.2 oz (115.8 kg) (>99 %, Z= 2.49)*  ? ?* Growth percentiles are based on CDC (Girls, 2-20 Years) data.  ?  ? ? ?ASSESSMENT & PLAN:   ? ?Intractable chronic migraine without  aura and without status migrainosus ?Was given Imitrex, but did not help much ?Maxalt for breakthrough headache ?Requires frequent use of Maxalt, will start prophylactic treatment - Topiramate ?Referred to Neurology ? ? ? ?Time:   ?Today, I have spent 12 minutes reviewing the chart, including problem list, medications, and with the patient with telehealth technology discussing the above problems. ? ? ?Medication Adjustments/Labs and Tests Ordered: ?Current medicines are reviewed at length with the patient today.   Concerns regarding medicines are outlined above.  ? ?Tests Ordered: ?No orders of the defined types were placed in this encounter. ? ? ?Medication Changes: ?No orders of the defined types were placed in this encounter. ? ? ? ?Note: This dictation was prepared with Dragon dictation along with smaller phrase technology. Similar sounding words can be transcribed inadequately or may not be corrected upon review. Any transcriptional errors that result from this process are unintentional.  ?  ? ? ?Disposition:  Follow up  ?Signed, ?Anabel Halon, MD  ?10/31/2021 1:54 PM    ? ?[facility] ?[facility] ?

## 2021-10-31 NOTE — Assessment & Plan Note (Signed)
Was given Imitrex, but did not help much ?Maxalt for breakthrough headache ?Requires frequent use of Maxalt, will start prophylactic treatment - Topiramate ?Referred to Neurology ?

## 2022-01-14 ENCOUNTER — Other Ambulatory Visit: Payer: Self-pay | Admitting: Internal Medicine

## 2022-01-14 DIAGNOSIS — G43719 Chronic migraine without aura, intractable, without status migrainosus: Secondary | ICD-10-CM

## 2022-03-01 ENCOUNTER — Ambulatory Visit
Admission: EM | Admit: 2022-03-01 | Discharge: 2022-03-01 | Disposition: A | Discharge status: Home / Self Care | Payer: Medicaid Other | Attending: Family Medicine | Admitting: Family Medicine

## 2022-03-01 DIAGNOSIS — Z20822 Contact with and (suspected) exposure to covid-19: Secondary | ICD-10-CM | POA: Diagnosis not present

## 2022-03-01 DIAGNOSIS — R051 Acute cough: Secondary | ICD-10-CM | POA: Diagnosis not present

## 2022-03-01 DIAGNOSIS — J029 Acute pharyngitis, unspecified: Secondary | ICD-10-CM | POA: Insufficient documentation

## 2022-03-01 MED ORDER — PROMETHAZINE-DM 6.25-15 MG/5ML PO SYRP: 5.0000 mL | ORAL_SOLUTION | Freq: Four times a day (QID) | ORAL | 0 refills | Status: DC | PRN

## 2022-03-01 NOTE — Discharge Instructions (Signed)
You have been tested for COVID-19 today. °If your test returns positive, you will receive a phone call from [place] regarding your results. °Negative test results are not called. °Both positive and negative results area always visible on MyChart. °If you do not have a MyChart account, sign up instructions are provided in your discharge papers. °Please do not hesitate to contact us should you have questions or concerns. ° °

## 2022-03-01 NOTE — ED Triage Notes (Signed)
Pt reports sore throat and cough x 2 days.   Pt requested COVID test.

## 2022-03-01 NOTE — ED Provider Notes (Signed)
  [facility]   [id number] 03/01/22 Arrival Time: 3570  ASSESSMENT & PLAN:  1. Acute cough   2. Sore throat    Discussed typical duration of viral illnesses. COVID testing sent. OTC symptom care as needed.  New Prescriptions   PROMETHAZINE-DEXTROMETHORPHAN (PROMETHAZINE-DM) 6.25-15 MG/5ML SYRUP    Take 5 mLs by mouth 4 (four) times daily as needed for cough.     Follow-up Information     Anabel Halon, MD.   Specialty: Internal Medicine Why: As needed. Contact information: [address] [place] [address] [phone]                 Reviewed expectations re: course of current medical issues. Questions answered. Outlined signs and symptoms indicating need for more acute intervention. Understanding verbalized. After Visit Summary given.   SUBJECTIVE: History from: Patient. Emily [name] is a 21 y.o. female. Reports: cough/ST; 2 days. Denies: fever. Normal PO intake without n/v/d.  OBJECTIVE:  Vitals:   03/01/22 0816  BP: 121/81  Pulse: (!) 110  Resp: 18  Temp: 98.5 F (36.9 C)  TempSrc: Oral  SpO2: 96%    Slight tachycardia noted. General appearance: alert; no distress Eyes: PERRLA; EOMI; conjunctiva normal HENT: [place]; AT; with nasal congestion Neck: supple  Lungs: speaks full sentences without difficulty; unlabored Extremities: no edema Skin: warm and dry Neurologic: normal gait Psychological: alert and cooperative; normal mood and affect  Labs:  Labs Reviewed  SARS CORONAVIRUS 2 (TAT 6-24 HRS)     No Known Allergies  Past Medical History:  Diagnosis Date   Anxiety    Phreesia 03/29/2020   Depression    Phreesia 03/29/2020   Febrile seizure (HCC) 12/2003   Migraines    UTI (urinary tract infection) 12/2003   Renal US WNL   Social History   Socioeconomic History   Marital status: Single    Spouse name: Not on file   Number of children: Not on file   Years of education: Not on file   Highest education level:  Not on file  Occupational History   Not on file  Tobacco Use   Smoking status: Never    Passive exposure: Yes   Smokeless tobacco: Never  Vaping Use   Vaping Use: Never used  Substance and Sexual Activity   Alcohol use: Never   Drug use: Never   Sexual activity: Never    Partners: Female    Birth control/protection: Implant  Other Topics Concern   Not on file  Social History Narrative   Not on file   Social Determinants of Health   Financial Resource Strain: Not on file  Food Insecurity: Not on file  Transportation Needs: Not on file  Physical Activity: Not on file  Stress: Not on file  Social Connections: Not on file  Intimate Partner Violence: Not on file   Family History  Problem Relation Age of Onset   Healthy Mother    Healthy Father    History reviewed. No pertinent surgical history.   Mardella Layman, MD 03/01/22 [phone]

## 2022-03-02 LAB — SARS CORONAVIRUS 2 (TAT 6-24 HRS): SARS Coronavirus 2: NEGATIVE

## 2022-03-06 ENCOUNTER — Encounter: Payer: Self-pay | Admitting: Internal Medicine

## 2022-03-06 ENCOUNTER — Ambulatory Visit (INDEPENDENT_AMBULATORY_CARE_PROVIDER_SITE_OTHER): Payer: Medicaid Other | Admitting: Internal Medicine

## 2022-03-06 VITALS — BP 128/82 | HR 92 | Resp 18 | Ht 66.0 in | Wt 280.4 lb

## 2022-03-06 DIAGNOSIS — Z2821 Immunization not carried out because of patient refusal: Secondary | ICD-10-CM

## 2022-03-06 DIAGNOSIS — E559 Vitamin D deficiency, unspecified: Secondary | ICD-10-CM

## 2022-03-06 DIAGNOSIS — Z0001 Encounter for general adult medical examination with abnormal findings: Secondary | ICD-10-CM | POA: Diagnosis not present

## 2022-03-06 DIAGNOSIS — Z114 Encounter for screening for human immunodeficiency virus [HIV]: Secondary | ICD-10-CM

## 2022-03-06 DIAGNOSIS — F411 Generalized anxiety disorder: Secondary | ICD-10-CM

## 2022-03-06 DIAGNOSIS — Z1159 Encounter for screening for other viral diseases: Secondary | ICD-10-CM | POA: Diagnosis not present

## 2022-03-06 DIAGNOSIS — G43719 Chronic migraine without aura, intractable, without status migrainosus: Secondary | ICD-10-CM | POA: Diagnosis not present

## 2022-03-06 MED ORDER — TOPIRAMATE 25 MG PO TABS: 25.0000 mg | ORAL_TABLET | Freq: Two times a day (BID) | ORAL | 3 refills | Status: DC

## 2022-03-06 NOTE — Assessment & Plan Note (Signed)

## 2022-03-06 NOTE — Patient Instructions (Signed)
Please start taking Topiramate twice daily.  Please take Rizatriptan for breakthrough headaches.  Please continue taking other medications as prescribed.  Please follow low carb diet and ambulate as tolerated.

## 2022-03-06 NOTE — Progress Notes (Signed)
Established Patient Office Visit  Subjective:  Patient ID: Emily [name], female    DOB: 2000-11-25  Age: 21 y.o. MRN: [id number]  CC:  Chief Complaint  Patient presents with   Annual Exam    Annual exam     HPI Emily [name] is a 21 y.o. female with past medical history of GAD, migraine, morbid obesity and seasonal allergies who presents for annual physical.  GAD: Currently well controlled with Lexapro.  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  She appears to have social anxiety as well. She currently denies any anhedonia, SI or HI.  She c/o persistent headache episodes, at least once every week, up to 5 episodes in a week. She complains of intermittent headache, which is unilateral, lasts for few hours to a day and is associated with photophobia.  Her headache is triggered by certain smell and light. She denies watery eyes. She has tried Sumatriptan, which was not effective. She was given Maxalt with some relief. She was started on Topiramate, but has not noticed much difference in her headache frequency yet. She is going to see Neurologist in this month.  Past Medical History:  Diagnosis Date   Anxiety    Phreesia 03/29/2020   Depression    Phreesia 03/29/2020   Febrile seizure ([place]) 12/2003   Migraines    UTI (urinary tract infection) 12/2003   Renal US WNL    History reviewed. No pertinent surgical history.  Family History  Problem Relation Age of Onset   Healthy Mother    Healthy Father     Social History   Socioeconomic History   Marital status: Single    Spouse name: Not on file   Number of children: Not on file   Years of education: Not on file   Highest education level: Not on file  Occupational History   Not on file  Tobacco Use   Smoking status: Never    Passive exposure: Yes   Smokeless tobacco: Never  Vaping Use   Vaping Use: Never used  Substance and Sexual Activity   Alcohol use: Never   Drug use: Never   Sexual activity:  Never    Partners: Female    Birth control/protection: Implant  Other Topics Concern   Not on file  Social History Narrative   Not on file   Social Determinants of Health   Financial Resource Strain: Not on file  Food Insecurity: Not on file  Transportation Needs: Not on file  Physical Activity: Not on file  Stress: Not on file  Social Connections: Not on file  Intimate Partner Violence: Not on file    Outpatient Medications Prior to Visit  Medication Sig Dispense Refill   escitalopram (LEXAPRO) 10 MG tablet Take 1 tablet (10 mg total) by mouth at bedtime. 90 tablet 3   loratadine (CLARITIN) 10 MG tablet Take 10 mg by mouth daily.     NEXPLANON 68 MG IMPL implant      rizatriptan (MAXALT) 10 MG tablet Take 1 tablet (10 mg total) by mouth as needed for migraine. May repeat in 2 hours if needed. Maximum 2 per day. 10 tablet 2   topiramate (TOPAMAX) 25 MG tablet TAKE 1 TABLET BY MOUTH DAILY. 30 tablet 0   Multiple Vitamins-Minerals (MULTIVITAMIN ADULTS PO) Take by mouth. (Patient not taking: Reported on 03/06/2022)     promethazine-dextromethorphan (PROMETHAZINE-DM) 6.25-15 MG/5ML syrup Take 5 mLs by mouth 4 (four) times daily as needed for cough. (Patient not  taking: Reported on 03/06/2022) 118 mL 0   No facility-administered medications prior to visit.    No Known Allergies  ROS Review of Systems  Constitutional:  Negative for chills and fever.  HENT:  Negative for congestion, sinus pressure, sinus pain and sore throat.   Eyes:  Negative for pain and discharge.  Respiratory:  Negative for cough and shortness of breath.   Cardiovascular:  Negative for chest pain and palpitations.  Gastrointestinal:  Negative for abdominal pain, constipation, diarrhea, nausea and vomiting.  Endocrine: Negative for polydipsia and polyuria.  Genitourinary:  Positive for menstrual problem. Negative for dysuria and hematuria.  Musculoskeletal:  Negative for neck pain and neck stiffness.  Skin:   Negative for rash.  Neurological:  Positive for headaches. Negative for dizziness and weakness.  Psychiatric/Behavioral:  Negative for agitation, behavioral problems and dysphoric mood. The patient is nervous/anxious.       Objective:    Physical Exam Vitals reviewed.  Constitutional:      General: She is not in acute distress.    Appearance: She is obese. She is not diaphoretic.  HENT:     Head: Normocephalic and atraumatic.     Nose: Nose normal.     Mouth/Throat:     Mouth: Mucous membranes are moist.  Eyes:     General: No scleral icterus.    Extraocular Movements: Extraocular movements intact.  Cardiovascular:     Rate and Rhythm: Normal rate and regular rhythm.     Pulses: Normal pulses.     Heart sounds: Normal heart sounds. No murmur heard. Pulmonary:     Breath sounds: Normal breath sounds. No wheezing or rales.  Abdominal:     Palpations: Abdomen is soft.     Tenderness: There is no abdominal tenderness.  Musculoskeletal:     Cervical back: Neck supple. No tenderness.     Right lower leg: No edema.     Left lower leg: No edema.  Skin:    General: Skin is warm.     Findings: No rash.  Neurological:     General: No focal deficit present.     Mental Status: She is alert and oriented to person, place, and time.     Cranial Nerves: No cranial nerve deficit.     Sensory: No sensory deficit.     Motor: No weakness.  Psychiatric:        Mood and Affect: Mood is anxious.        Behavior: Behavior normal.     BP 128/82 (BP Location: Left Arm, Patient Position: Sitting, Cuff Size: Normal)   Pulse 92   Resp 18   Ht _0  (1.676 m)   Wt 280 lb 6.4 oz (127.2 kg)   SpO2 99%   BMI 45.26 kg/m  Wt Readings from Last 3 Encounters:  03/06/22 280 lb 6.4 oz (127.2 kg) (>99 %, Z= 2.75)*  08/29/21 279 lb (126.6 kg) (>99 %, Z= 2.70)*  10/26/20 257 lb 3.2 oz (116.7 kg) (>99 %, Z= 2.51)*   * Growth percentiles are based on CDC (Girls, 2-20 Years) data.    Lab Results   Component Value Date   TSH 1.03 08/31/2019   No results found for: "WBC", "HGB", "HCT", "MCV", "PLT" No results found for: "NA", "K", "CHLORIDE", "CO2", "GLUCOSE", "BUN", "CREATININE", "BILITOT", "ALKPHOS", "AST", "ALT", "PROT", "ALBUMIN", "CALCIUM", "ANIONGAP", "EGFR", "GFR" No results found for: "CHOL" No results found for: "HDL" No results found for: "LDLCALC" No results found for: "TRIG" No results  found for: "CHOLHDL" Lab Results  Component Value Date   HGBA1C 4.6 08/31/2019      Assessment & Plan:   Problem List Items Addressed This Visit       Cardiovascular and Mediastinum   Intractable chronic migraine without aura and without status migrainosus    Was given Imitrex, but did not help much Maxalt for breakthrough headache Required frequent use of Maxalt, started prophylactic treatment - Topiramate, increased dose to 25 mg BID Referred to Neurology      Relevant Medications   topiramate (TOPAMAX) 25 MG tablet   Other Relevant Orders   TSH   CMP14+EGFR   CBC with Differential/Platelet     Other   Generalized anxiety disorder    Well controlled with Lexapro currently, refilled      Morbid obesity ([place])    Advised to follow low-carb diet and perform moderate exercise at least 150 minutes/week      Relevant Orders   Lipid panel   Refused influenza vaccine   Encounter for general adult medical examination with abnormal findings - Primary    Physical exam as documented. Counseling done  re healthy lifestyle involving commitment to 150 minutes exercise per week, heart healthy diet, and attaining healthy weight.The importance of adequate sleep also discussed. Changes in health habits are decided on by the patient with goals and time frames  set for achieving them. Immunization and cancer screening needs are specifically addressed at this visit.      Relevant Orders   TSH   Lipid panel   CMP14+EGFR   CBC with Differential/Platelet   Other Visit Diagnoses      Vitamin D deficiency       Relevant Orders   VITAMIN D 25 Hydroxy (Vit-D Deficiency, Fractures)   Screening for HIV (human immunodeficiency virus)       Relevant Orders   HIV antibody (with reflex)   Need for hepatitis C screening test       Relevant Orders   Hepatitis C Antibody       Meds ordered this encounter  Medications   topiramate (TOPAMAX) 25 MG tablet    Sig: Take 1 tablet (25 mg total) by mouth 2 (two) times daily.    Dispense:  60 tablet    Refill:  3    Follow-up: Return in about 6 months (around 09/04/2022) for GAD and migraine.    Lindell Spar, MD

## 2022-03-06 NOTE — Assessment & Plan Note (Addendum)
Advised to follow low-carb diet and perform moderate exercise at least 150 minutes/week 

## 2022-03-06 NOTE — Assessment & Plan Note (Signed)
Well controlled with Lexapro currently, refilled

## 2022-03-06 NOTE — Assessment & Plan Note (Signed)
Was given Imitrex, but did not help much Maxalt for breakthrough headache Required frequent use of Maxalt, started prophylactic treatment - Topiramate, increased dose to 25 mg BID Referred to Neurology

## 2022-03-07 ENCOUNTER — Telehealth: Payer: Self-pay | Admitting: Internal Medicine

## 2022-03-07 ENCOUNTER — Encounter: Payer: Medicaid Other | Admitting: Internal Medicine

## 2022-03-07 ENCOUNTER — Other Ambulatory Visit: Payer: Self-pay | Admitting: Internal Medicine

## 2022-03-07 DIAGNOSIS — E559 Vitamin D deficiency, unspecified: Secondary | ICD-10-CM | POA: Insufficient documentation

## 2022-03-07 LAB — CBC WITH DIFFERENTIAL/PLATELET
Basophils Absolute: 0.1 10*3/uL (ref 0.0–0.2)
Basos: 1 %
EOS (ABSOLUTE): 0.1 10*3/uL (ref 0.0–0.4)
Eos: 1 %
Hematocrit: 42.7 % (ref 34.0–46.6)
Hemoglobin: 13.9 g/dL (ref 11.1–15.9)
Immature Grans (Abs): 0 10*3/uL (ref 0.0–0.1)
Immature Granulocytes: 0 %
Lymphocytes Absolute: 2 10*3/uL (ref 0.7–3.1)
Lymphs: 27 %
MCH: 30.4 pg (ref 26.6–33.0)
MCHC: 32.6 g/dL (ref 31.5–35.7)
MCV: 93 fL (ref 79–97)
Monocytes Absolute: 0.6 10*3/uL (ref 0.1–0.9)
Monocytes: 8 %
Neutrophils Absolute: 4.8 10*3/uL (ref 1.4–7.0)
Neutrophils: 63 %
Platelets: 287 10*3/uL (ref 150–450)
RBC: 4.57 x10E6/uL (ref 3.77–5.28)
RDW: 11.9 % (ref 11.7–15.4)
WBC: 7.5 10*3/uL (ref 3.4–10.8)

## 2022-03-07 LAB — CMP14+EGFR
ALT: 13 IU/L (ref 0–32)
AST: 12 IU/L (ref 0–40)
Albumin/Globulin Ratio: 2 (ref 1.2–2.2)
Albumin: 4.8 g/dL (ref 4.0–5.0)
Alkaline Phosphatase: 32 IU/L — ABNORMAL LOW (ref 42–106)
BUN/Creatinine Ratio: 14 (ref 9–23)
BUN: 9 mg/dL (ref 6–20)
Bilirubin Total: 0.3 mg/dL (ref 0.0–1.2)
CO2: 19 mmol/L — ABNORMAL LOW (ref 20–29)
Calcium: 9.7 mg/dL (ref 8.7–10.2)
Chloride: 104 mmol/L (ref 96–106)
Creatinine, Ser: 0.64 mg/dL (ref 0.57–1.00)
Globulin, Total: 2.4 g/dL (ref 1.5–4.5)
Glucose: 92 mg/dL (ref 70–99)
Potassium: 4.3 mmol/L (ref 3.5–5.2)
Sodium: 136 mmol/L (ref 134–144)
Total Protein: 7.2 g/dL (ref 6.0–8.5)
eGFR: 130 mL/min/{1.73_m2} (ref >59)

## 2022-03-07 LAB — HEPATITIS C ANTIBODY: Hep C Virus Ab: NONREACTIVE

## 2022-03-07 LAB — LIPID PANEL
Chol/HDL Ratio: 4.1 ratio (ref 0.0–4.4)
Cholesterol, Total: 152 mg/dL (ref 100–169)
HDL: 37 mg/dL — ABNORMAL LOW (ref >39)
LDL Chol Calc (NIH): 86 mg/dL (ref 0–109)
Triglycerides: 170 mg/dL — ABNORMAL HIGH (ref 0–89)
VLDL Cholesterol Cal: 29 mg/dL (ref 5–40)

## 2022-03-07 LAB — TSH: TSH: 1.29 u[IU]/mL (ref 0.450–4.500)

## 2022-03-07 LAB — VITAMIN D 25 HYDROXY (VIT D DEFICIENCY, FRACTURES): Vit D, 25-Hydroxy: 12.7 ng/mL — ABNORMAL LOW (ref 30.0–100.0)

## 2022-03-07 LAB — HIV ANTIBODY (ROUTINE TESTING W REFLEX): HIV Screen 4th Generation wRfx: NONREACTIVE

## 2022-03-07 MED ORDER — VITAMIN D (ERGOCALCIFEROL) 1.25 MG (50000 UNIT) PO CAPS: 50000.0000 [IU] | ORAL_CAPSULE | ORAL | 1 refills | Status: DC

## 2022-03-07 NOTE — Telephone Encounter (Signed)
Pt returning call

## 2022-03-07 NOTE — Telephone Encounter (Signed)
Returned pt call  

## 2022-03-19 NOTE — Progress Notes (Signed)
NEUROLOGY CONSULTATION NOTE  [name] MRN: [id number] DOB: December 03, 2000  Referring provider: Trena Platt, MD Primary care provider: Trena Platt, MD  Reason for consult:  migraines  Assessment/Plan:   Migraine without aura, without status migrainosus, not intractable  Migraine prevention:  increase topiramate to 100mg  at bedtime Migraine rescue:  she will try samples of Ubrelvy 100mg  Limit use of pain relievers to no more than 2 days out of week to prevent risk of rebound or medication-overuse headache. Keep headache diary Discussed lifestyle modification Follow up 4-5 months.    Subjective:  Emily [name] is a 21 year old female with depression, anxiety and history of febrile seizure who presents for migraines.  History supplemented by her accompanying mother as well as UC and referring provider's notes.  Onset:  26-29 years old Location:  varies - unilateral (either side), forehead Quality:  stabbing, throbbing Intensity:  severe Aura:  absent Prodrome:  absent Associated symptoms:  nausea, photophobia, phonophobia, osmophobia.  She denies associated vomiting, unilateral numbness or weakness. Duration:  all day Frequency:  2 days a week Frequency of abortive medication: 2 days a week Triggers:  hormones (improved on Nexplanon), stress, too much sleep, sleep deprivation, hair tied too tight, dairy, sweets Relieving factors:  sleep, cold rag on forehead, laying in dark Activity:  activity aggravates headache  Past NSAIDS/analgesics:  ibuprofen, naproxen, Excedrin Past abortive triptans:  sumatriptan tab Past abortive ergotamine:  none Past muscle relaxants:  none Past anti-emetic:  ondansetron 4mg  Past antihypertensive medications:  none Past antidepressant medications:  none Past anticonvulsant medications:  none Past anti-CGRP:  none Past vitamins/Herbal/Supplements:  none Past antihistamines/decongestants:  cetirizine  Other past therapies:  none  Rescue  protocol:  rizatriptan Current NSAIDS/analgesics:  none Current triptans:  rizatriptan 10mg  (sometimes causes migraines to last longer) Current ergotamine:  none Current anti-emetic:  none Current muscle relaxants:  none Current Antihypertensive medications:  none Current Antidepressant medications:  escitaopram 10mg  Current Anticonvulsant medications:  topiramate 25mg  BID Current anti-CGRP:  none Current Vitamins/Herbal/Supplements:  D Current Antihistamines/Decongestants:  loratidine Other therapy:  none Hormone/birth control:  Nexplanon   Caffeine:  Dr. 10.  No coffee Diet:  30 oz water daily.  Soda (zero-sugar and sugar).  Skips meals Exercise:  no Depression:  stable; Anxiety:  yes Other pain:  no Sleep hygiene:  varies.  Sometimes stays up late and falls asleep at midnight and wakes up at 10 AM Family history of headache:  no      PAST MEDICAL HISTORY: Past Medical History:  Diagnosis Date   Anxiety    Phreesia 03/29/2020   Depression    Phreesia 03/29/2020   Febrile seizure (HCC) 12/2003   Migraines    UTI (urinary tract infection) 12/2003   Renal WNL    PAST SURGICAL HISTORY: No past surgical history on file.  MEDICATIONS: Current Outpatient Medications on File Prior to Visit  Medication Sig Dispense Refill   escitalopram (LEXAPRO) 10 MG tablet Take 1 tablet (10 mg total) by mouth at bedtime. 90 tablet 3   loratadine (CLARITIN) 10 MG tablet Take 10 mg by mouth daily.     NEXPLANON 68 MG IMPL implant      rizatriptan (MAXALT) 10 MG tablet Take 1 tablet (10 mg total) by mouth as needed for migraine. May repeat in 2 hours if needed. Maximum 2 per day. 10 tablet 2   topiramate (TOPAMAX) 25 MG tablet Take 1 tablet (25 mg total) by mouth 2 (two) times daily. 60  tablet 3   Vitamin D, Ergocalciferol, (DRISDOL) 1.25 MG (50000 UNIT) CAPS capsule Take 1 capsule (50,000 Units total) by mouth every 7 (seven) days. 12 capsule 1   No current facility-administered  medications on file prior to visit.    ALLERGIES: No Known Allergies  FAMILY HISTORY: Family History  Problem Relation Age of Onset   Healthy Mother    Healthy Father     Objective:  Blood pressure 112/75, pulse 89, height 5\' 5"  (1.651 m), weight 278 lb 6.4 oz (126.3 kg), SpO2 97 %. General: No acute distress.  Patient appears well-groomed.   Head:  Normocephalic/atraumatic Eyes:  fundi examined but not visualized Neck: supple, no paraspinal tenderness, full range of motion Back: No paraspinal tenderness Heart: regular rate and rhythm Lungs: Clear to auscultation bilaterally. Vascular: No carotid bruits. Neurological Exam: Mental status: alert and oriented to person, place, and time, speech fluent and not dysarthric, language intact. Cranial nerves: CN I: not tested CN II: pupils equal, round and reactive to light, visual fields intact CN III, IV, VI:  full range of motion, no nystagmus, no ptosis CN V: facial sensation intact. CN VII: upper and lower face symmetric CN VIII: hearing intact CN IX, X: gag intact, uvula midline CN XI: sternocleidomastoid and trapezius muscles intact CN XII: tongue midline Bulk & Tone: normal, no fasciculations. Motor:  muscle strength 5/5 throughout Sensation:  Pinprick, temperature and vibratory sensation intact. Deep Tendon Reflexes:  2+ throughout,  toes downgoing.  Finger to nose testing:  Without dysmetria.   Heel to shin:  Without dysmetria.   Gait:  Normal station and stride.  Romberg negative.    Thank you for allowing me to take part in the care of this patient.  Metta Clines, DO  CC: Ihor Dow, MD

## 2022-03-21 ENCOUNTER — Encounter: Payer: Self-pay | Admitting: Neurology

## 2022-03-21 ENCOUNTER — Ambulatory Visit (INDEPENDENT_AMBULATORY_CARE_PROVIDER_SITE_OTHER): Payer: Medicaid Other | Admitting: Neurology

## 2022-03-21 VITALS — BP 112/75 | HR 89 | Ht 65.0 in | Wt 278.4 lb

## 2022-03-21 DIAGNOSIS — G43009 Migraine without aura, not intractable, without status migrainosus: Secondary | ICD-10-CM | POA: Diagnosis not present

## 2022-03-21 MED ORDER — UBRELVY 100 MG PO TABS: ORAL_TABLET | ORAL | 0 refills | Status: DC

## 2022-03-21 MED ORDER — TOPIRAMATE 50 MG PO TABS: 100.0000 mg | ORAL_TABLET | Freq: Every day | ORAL | 5 refills | Status: DC

## 2022-03-21 NOTE — Patient Instructions (Signed)
  Increase topiramate to 100mg  at bedtime.  Contact us in 6 weeks with update and we can increase dose if needed. Stop rizatriptan.  Take Ubrelvy 100mg  at earliest onset of headache.  May repeat dose once in 2 hours if needed.  Maximum 2 tablets in 24 hours.  Let me know if effective. Limit use of pain relievers to no more than 2 days out of the week.  These medications include acetaminophen, NSAIDs (ibuprofen/Advil/Motrin, naproxen/Aleve, triptans (Imitrex/sumatriptan), Excedrin, and narcotics.  This will help reduce risk of rebound headaches. Be aware of common food triggers:  - Caffeine:  coffee, black tea, cola, Mt. Dew  - Chocolate  - Dairy:  aged cheeses (brie, blue, cheddar, gouda, [place], provolone, [place], Swiss, etc), chocolate milk, buttermilk, sour cream, limit eggs and yogurt  - Nuts, peanut butter  - Alcohol  - Cereals/grains:  FRESH breads (fresh bagels, sourdough, doughnuts), yeast productions  - Processed/canned/aged/cured meats (pre-packaged deli meats, hotdogs)  - MSG/glutamate:  soy sauce, flavor enhancer, pickled/preserved/marinated foods  - Sweeteners:  aspartame (Equal, Nutrasweet).  Sugar and Splenda are okay  - Vegetables:  legumes (lima beans, lentils, snow peas, fava beans, pinto peans, peas, garbanzo beans), sauerkraut, onions, olives, pickles  - Fruit:  avocados, bananas, citrus fruit (orange, lemon, grapefruit), mango  - Other:  Frozen meals, macaroni and cheese Routine exercise Stay adequately hydrated (aim for 64 oz water daily) Keep headache diary Maintain proper stress management Maintain proper sleep hygiene Do not skip meals Consider supplements:  magnesium citrate 400mg  daily, riboflavin 400mg  daily, coenzyme Q10 100mg  three times daily. 12. Follow up 4-5 months.

## 2022-05-07 ENCOUNTER — Ambulatory Visit
Admission: EM | Admit: 2022-05-07 | Discharge: 2022-05-07 | Disposition: A | Discharge status: Home / Self Care | Payer: Medicaid Other | Attending: Nurse Practitioner | Admitting: Nurse Practitioner

## 2022-05-07 DIAGNOSIS — Z1152 Encounter for screening for COVID-19: Secondary | ICD-10-CM | POA: Insufficient documentation

## 2022-05-07 DIAGNOSIS — R051 Acute cough: Secondary | ICD-10-CM | POA: Insufficient documentation

## 2022-05-07 DIAGNOSIS — B349 Viral infection, unspecified: Secondary | ICD-10-CM

## 2022-05-07 LAB — POCT RAPID STREP A (OFFICE): Rapid Strep A Screen: NEGATIVE

## 2022-05-07 MED ORDER — PROMETHAZINE-DM 6.25-15 MG/5ML PO SYRP: 5.0000 mL | ORAL_SOLUTION | Freq: Four times a day (QID) | ORAL | 0 refills | Status: DC | PRN

## 2022-05-07 NOTE — Discharge Instructions (Addendum)
The rapid strep test is negative, a throat culture and COVID test are pending. Take medication as prescribed. Increase fluids and allow for plenty of rest. Recommend over-the-counter Tylenol or ibuprofen as needed for pain, fever, or general discomfort. Warm salt water gargles 3-4 times daily while symptoms persist. Also recommend a soft diet until throat pain improves. Recommend a brat diet until nausea vomiting improved.  This includes bananas, rice, applesauce, soups, broths. Follow-up if symptoms do not improve over the next 7 to 10 days.  Follow-up sooner with your primary care physician if symptoms worsen.

## 2022-05-07 NOTE — ED Triage Notes (Signed)
Pt reports cough, vomiting, sore throat and feeling hot x 2 days. OTC cold meds gives some relief.

## 2022-05-07 NOTE — ED Provider Notes (Signed)
[facility]    CSN: [id number] Arrival date & time: 05/07/22  0800      History   Chief Complaint Chief Complaint  Patient presents with   Sore Throat   Cough    HPI Emily [name] is a 21 y.o. female.   The history is provided by the patient.   Patient presents with a 2-day history of cough, sore throat, and vomiting.  She states that she has also had a fever, stating that she has "felt hot".  She also reports that she has vomited 3 times with her last episode this morning.  She denies headache, nasal congestion, runny nose, shortness of breath, wheezing, abdominal pain, or diarrhea.  She has taken over-the-counter cough medications with some relief.  Reports that her grandmother and mother have been sick with the same or similar symptoms.  Past Medical History:  Diagnosis Date   Anxiety    Phreesia 03/29/2020   Depression    Phreesia 03/29/2020   Febrile seizure (HCC) 12/2003   Migraines    UTI (urinary tract infection) 12/2003   Renal US WNL    Patient Active Problem List   Diagnosis Date Noted   Vitamin D deficiency 03/07/2022   Refused influenza vaccine 03/06/2022   Encounter for general adult medical examination with abnormal findings 03/06/2022   Intractable chronic migraine without aura and without status migrainosus 08/29/2021   Generalized anxiety disorder 08/29/2021   Morbid obesity (HCC) 08/29/2021   Irregular menstrual bleeding 08/29/2021   Seasonal allergies 08/29/2021    History reviewed. No pertinent surgical history.  OB History   No obstetric history on file.      Home Medications    Prior to Admission medications   Medication Sig Start Date End Date Taking? Authorizing Provider  promethazine-dextromethorphan (PROMETHAZINE-DM) 6.25-15 MG/5ML syrup Take 5 mLs by mouth 4 (four) times daily as needed for cough. 05/07/22  Yes Shandy Vi-Warren, Sadie Haber, NP  escitalopram (LEXAPRO) 10 MG tablet Take 1 tablet (10 mg total) by mouth at  bedtime. 08/29/21   Anabel Halon, MD  loratadine (CLARITIN) 10 MG tablet Take 10 mg by mouth daily.    [provider]  NEXPLANON 68 MG IMPL implant  06/04/21   [provider]  ondansetron (ZOFRAN) 4 MG tablet Take 4 mg by mouth every 8 (eight) hours as needed for nausea or vomiting.    [provider]  rizatriptan (MAXALT) 10 MG tablet Take 1 tablet (10 mg total) by mouth as needed for migraine. May repeat in 2 hours if needed. Maximum 2 per day. 08/29/21   Anabel Halon, MD  topiramate (TOPAMAX) 50 MG tablet Take 2 tablets (100 mg total) by mouth at bedtime. 03/21/22   Everlena Cooper, Adam R, DO  Ubrogepant (UBRELVY) 100 MG TABS Samples of this drug were given to the patient, quantity 4, Lot Number [id number] exp 02/2024 03/21/22   Drema Dallas, DO  Vitamin D, Ergocalciferol, (DRISDOL) 1.25 MG (50000 UNIT) CAPS capsule Take 1 capsule (50,000 Units total) by mouth every 7 (seven) days. 03/07/22   Anabel Halon, MD    Family History Family History  Problem Relation Age of Onset   Healthy Mother    Healthy Father     Social History Social History   Tobacco Use   Smoking status: Never    Passive exposure: Yes   Smokeless tobacco: Never  Vaping Use   Vaping Use: Never used  Substance Use Topics   Alcohol use: Never  Drug use: Never     Allergies   Patient has no known allergies.   Review of Systems Review of Systems Per HPI  Physical Exam Triage Vital Signs ED Triage Vitals [05/07/22 0825]  Enc Vitals Group     BP      Pulse      Resp      Temp      Temp src      SpO2      Weight      Height      Head Circumference      Peak Flow      Pain Score 10     Pain Loc      Pain Edu?      Excl. in [place]?    No data found.  Updated Vital Signs BP 126/77 (BP Location: Right Wrist)   Pulse (!) 126   Temp 100.2 F (37.9 C) (Oral)   Resp 18   SpO2 98%   Visual Acuity Right Eye Distance:   Left Eye Distance:   Bilateral Distance:    Right Eye  Near:   Left Eye Near:    Bilateral Near:     Physical Exam Vitals and nursing note reviewed.  Constitutional:      Appearance: She is well-developed.  HENT:     Head: Normocephalic.     Right Ear: Tympanic membrane and ear canal normal.     Left Ear: Tympanic membrane and ear canal normal.     Nose: No congestion or rhinorrhea.     Mouth/Throat:     Mouth: Mucous membranes are moist.     Pharynx: Uvula midline. No oropharyngeal exudate.     Tonsils: 1+ on the right. 1+ on the left.  Eyes:     Conjunctiva/sclera: Conjunctivae normal.     Pupils: Pupils are equal, round, and reactive to light.  Cardiovascular:     Rate and Rhythm: Regular rhythm. Tachycardia present.     Heart sounds: Normal heart sounds.  Pulmonary:     Effort: Pulmonary effort is normal.     Breath sounds: Normal breath sounds.  Abdominal:     General: Bowel sounds are normal. There is no distension.     Palpations: Abdomen is soft.     Tenderness: There is no abdominal tenderness. There is no guarding or rebound.  Genitourinary:    Vagina: Normal. No vaginal discharge.  Musculoskeletal:     Cervical back: Normal range of motion.  Lymphadenopathy:     Cervical: No cervical adenopathy.  Skin:    General: Skin is warm and dry.     Findings: No erythema or rash.  Neurological:     General: No focal deficit present.     Mental Status: She is alert and oriented to person, place, and time.     Cranial Nerves: No cranial nerve deficit.  Psychiatric:        Mood and Affect: Mood normal.        Behavior: Behavior normal.      UC Treatments / Results  Labs (all labs ordered are listed, but only abnormal results are displayed) Labs Reviewed  CULTURE, GROUP A STREP ([place])  SARS CORONAVIRUS 2 (TAT 6-24 HRS)  POCT RAPID STREP A (OFFICE)    EKG   Radiology No results found.  Procedures Procedures (including critical care time)  Medications Ordered in UC Medications - No data to  display  Initial Impression / Assessment and Plan / UC Course  I have reviewed the triage vital signs and the nursing notes.  Pertinent labs & imaging results that were available during my care of the patient were reviewed by me and considered in my medical decision making (see chart for details).  Rapid strep test is negative, throat culture and COVID test are pending.  Symptoms appear to be consistent with a viral illness at this time.  For patient's cough and nausea, patient was prescribed Promethazine DM.  Discussed viral etiology with the patient.  Patient declines to have viral therapy if COVID test is positive.  Supportive care recommendations were provided to the patient to include increasing fluids, allowing for plenty of rest, and a brat diet.  Patient is given strict follow-up precautions.  Patient verbalizes understanding.  All questions were answered.  Patient stable for discharge. Final Clinical Impressions(s) / UC Diagnoses   Final diagnoses:  Viral illness  Acute cough     Discharge Instructions      The rapid strep test is negative, a throat culture and COVID test are pending. Take medication as prescribed. Increase fluids and allow for plenty of rest. Recommend over-the-counter Tylenol or ibuprofen as needed for pain, fever, or general discomfort. Warm salt water gargles 3-4 times daily while symptoms persist. Also recommend a soft diet until throat pain improves. Recommend a brat diet until nausea vomiting improved.  This includes bananas, rice, applesauce, soups, broths. Follow-up if symptoms do not improve over the next 7 to 10 days.  Follow-up sooner with your primary care physician if symptoms worsen.     ED Prescriptions     Medication Sig Dispense Auth. Provider   promethazine-dextromethorphan (PROMETHAZINE-DM) 6.25-15 MG/5ML syrup Take 5 mLs by mouth 4 (four) times daily as needed for cough. 118 mL Shamika Pedregon-Warren, Alda Lea, NP      PDMP not reviewed  this encounter.   Tish Men, NP 05/07/22 [phone]

## 2022-05-08 LAB — SARS CORONAVIRUS 2 (TAT 6-24 HRS): SARS Coronavirus 2: NEGATIVE

## 2022-05-09 ENCOUNTER — Telehealth (HOSPITAL_COMMUNITY): Payer: Self-pay | Admitting: Emergency Medicine

## 2022-05-09 LAB — CULTURE, GROUP A STREP (THRC)

## 2022-05-09 MED ORDER — AZITHROMYCIN 250 MG PO TABS: 250.0000 mg | ORAL_TABLET | Freq: Every day | ORAL | 0 refills | Status: DC

## 2022-06-26 ENCOUNTER — Ambulatory Visit
Admission: EM | Admit: 2022-06-26 | Discharge: 2022-06-26 | Disposition: A | Discharge status: Home / Self Care | Payer: Medicaid Other | Attending: Family Medicine | Admitting: Family Medicine

## 2022-06-26 ENCOUNTER — Encounter: Payer: Self-pay | Admitting: Emergency Medicine

## 2022-06-26 ENCOUNTER — Other Ambulatory Visit: Payer: Self-pay

## 2022-06-26 DIAGNOSIS — R059 Cough, unspecified: Secondary | ICD-10-CM | POA: Diagnosis present

## 2022-06-26 DIAGNOSIS — J029 Acute pharyngitis, unspecified: Secondary | ICD-10-CM | POA: Diagnosis present

## 2022-06-26 DIAGNOSIS — Z1152 Encounter for screening for COVID-19: Secondary | ICD-10-CM | POA: Insufficient documentation

## 2022-06-26 DIAGNOSIS — J069 Acute upper respiratory infection, unspecified: Secondary | ICD-10-CM | POA: Diagnosis not present

## 2022-06-26 MED ORDER — PROMETHAZINE-DM 6.25-15 MG/5ML PO SYRP: 5.0000 mL | ORAL_SOLUTION | Freq: Four times a day (QID) | ORAL | 0 refills | Status: DC | PRN

## 2022-06-26 MED ORDER — FLUTICASONE PROPIONATE 50 MCG/ACT NA SUSP: 1.0000 | Freq: Two times a day (BID) | NASAL | 2 refills | Status: DC

## 2022-06-26 NOTE — ED Triage Notes (Signed)
Pt reports sore throat and cough x2 days. Denies any known fevers.

## 2022-06-26 NOTE — ED Provider Notes (Signed)
[facility]    CSN: [id number] Arrival date & time: 06/26/22  1432      History   Chief Complaint Chief Complaint  Patient presents with   Sore Throat    HPI Emily [name] is a 22 y.o. female.   Patient presenting today with 2-day history of sore throat, cough, nasal congestion.  Denies fever, chills, chest pain, shortness of breath, abdominal pain, nausea vomiting or diarrhea.  So far trying over-the-counter remedies with no relief.  States her mom is sick with similar symptoms but unsure with what.    Past Medical History:  Diagnosis Date   Anxiety    Phreesia 03/29/2020   Depression    Phreesia 03/29/2020   Febrile seizure ([place]) 12/2003   Migraines    UTI (urinary tract infection) 12/2003   Renal US WNL    Patient Active Problem List   Diagnosis Date Noted   Vitamin D deficiency 03/07/2022   Refused influenza vaccine 03/06/2022   Encounter for general adult medical examination with abnormal findings 03/06/2022   Intractable chronic migraine without aura and without status migrainosus 08/29/2021   Generalized anxiety disorder 08/29/2021   Morbid obesity ([place]) 08/29/2021   Irregular menstrual bleeding 08/29/2021   Seasonal allergies 08/29/2021    History reviewed. No pertinent surgical history.  OB History   No obstetric history on file.      Home Medications    Prior to Admission medications   Medication Sig Start Date End Date Taking? Authorizing Provider  fluticasone (FLONASE) 50 MCG/ACT nasal spray Place 1 spray into both nostrils 2 (two) times daily. 06/26/22  Yes Volney American, PA-C  promethazine-dextromethorphan (PROMETHAZINE-DM) 6.25-15 MG/5ML syrup Take 5 mLs by mouth 4 (four) times daily as needed. 06/26/22  Yes Volney American, PA-C  azithromycin (ZITHROMAX) 250 MG tablet Take 1 tablet (250 mg total) by mouth daily. Take first 2 tablets together, then 1 every day until finished. 05/09/22   Chase Picket, MD   escitalopram (LEXAPRO) 10 MG tablet Take 1 tablet (10 mg total) by mouth at bedtime. 08/29/21   Lindell Spar, MD  loratadine (CLARITIN) 10 MG tablet Take 10 mg by mouth daily.    [provider]  NEXPLANON 68 MG IMPL implant  06/04/21   [provider]  ondansetron (ZOFRAN) 4 MG tablet Take 4 mg by mouth every 8 (eight) hours as needed for nausea or vomiting.    [provider]  promethazine-dextromethorphan (PROMETHAZINE-DM) 6.25-15 MG/5ML syrup Take 5 mLs by mouth 4 (four) times daily as needed for cough. 05/07/22   Leath-Warren, Alda Lea, NP  rizatriptan (MAXALT) 10 MG tablet Take 1 tablet (10 mg total) by mouth as needed for migraine. May repeat in 2 hours if needed. Maximum 2 per day. 08/29/21   Lindell Spar, MD  topiramate (TOPAMAX) 50 MG tablet Take 2 tablets (100 mg total) by mouth at bedtime. 03/21/22   Tomi Likens, Adam R, DO  Ubrogepant (UBRELVY) 100 MG TABS Samples of this drug were given to the patient, quantity 4, Lot Number [id number] exp 02/2024 03/21/22   Pieter Partridge, DO  Vitamin D, Ergocalciferol, (DRISDOL) 1.25 MG (50000 UNIT) CAPS capsule Take 1 capsule (50,000 Units total) by mouth every 7 (seven) days. 03/07/22   Lindell Spar, MD    Family History Family History  Problem Relation Age of Onset   Healthy Mother    Healthy Father     Social History Social History   Tobacco Use  Smoking status: Never    Passive exposure: Yes   Smokeless tobacco: Never  Vaping Use   Vaping Use: Never used  Substance Use Topics   Alcohol use: Never   Drug use: Never     Allergies   Patient has no known allergies.   Review of Systems Review of Systems Per HPI  Physical Exam Triage Vital Signs ED Triage Vitals  Enc Vitals Group     BP 06/26/22 1607 111/76     Pulse Rate 06/26/22 1607 95     Resp 06/26/22 1607 20     Temp 06/26/22 1607 98 F (36.7 C)     Temp Source 06/26/22 1607 Oral     SpO2 06/26/22 1607 96 %     Weight --      Height --       Head Circumference --      Peak Flow --      Pain Score 06/26/22 1606 6     Pain Loc --      Pain Edu? --      Excl. in [place]? --    No data found.  Updated Vital Signs BP 111/76 (BP Location: Right Arm)   Pulse 95   Temp 98 F (36.7 C) (Oral)   Resp 20   LMP 06/08/2022 (Approximate)   SpO2 96%   Visual Acuity Right Eye Distance:   Left Eye Distance:   Bilateral Distance:    Right Eye Near:   Left Eye Near:    Bilateral Near:     Physical Exam Vitals and nursing note reviewed.  Constitutional:      Appearance: Normal appearance.  HENT:     Head: Atraumatic.     Right Ear: Tympanic membrane and external ear normal.     Left Ear: Tympanic membrane and external ear normal.     Nose: Rhinorrhea present.     Mouth/Throat:     Mouth: Mucous membranes are moist.     Pharynx: Posterior oropharyngeal erythema present.  Eyes:     Extraocular Movements: Extraocular movements intact.     Conjunctiva/sclera: Conjunctivae normal.  Cardiovascular:     Rate and Rhythm: Normal rate and regular rhythm.     Heart sounds: Normal heart sounds.  Pulmonary:     Effort: Pulmonary effort is normal.     Breath sounds: Normal breath sounds. No wheezing or rales.  Musculoskeletal:        General: Normal range of motion.     Cervical back: Normal range of motion and neck supple.  Skin:    General: Skin is warm and dry.  Neurological:     Mental Status: She is alert and oriented to person, place, and time.  Psychiatric:        Mood and Affect: Mood normal.        Thought Content: Thought content normal.    UC Treatments / Results  Labs (all labs ordered are listed, but only abnormal results are displayed) Labs Reviewed  SARS CORONAVIRUS 2 (TAT 6-24 HRS)    EKG   Radiology No results found.  Procedures Procedures (including critical care time)  Medications Ordered in UC Medications - No data to display  Initial Impression / Assessment and Plan / UC Course  I have  reviewed the triage vital signs and the nursing notes.  Pertinent labs & imaging results that were available during my care of the patient were reviewed by me and considered in my medical decision making (see  chart for details).     Vital signs and exam reassuring, suspicious for viral upper respiratory infection.  COVID testing pending, treat with Phenergan DM, Flonase, supportive over-the-counter medications and home care.  Work note given.  Return for worsening symptoms.  Final Clinical Impressions(s) / UC Diagnoses   Final diagnoses:  Viral URI with cough   Discharge Instructions   None    ED Prescriptions     Medication Sig Dispense Auth. Provider   promethazine-dextromethorphan (PROMETHAZINE-DM) 6.25-15 MG/5ML syrup Take 5 mLs by mouth 4 (four) times daily as needed. 100 mL Volney American, PA-C   fluticasone [facility]) 50 MCG/ACT nasal spray Place 1 spray into both nostrils 2 (two) times daily. 16 g Volney American, [place]      PDMP not reviewed this encounter.   Volney American, [place] 06/26/22 1656

## 2022-06-26 NOTE — ED Notes (Signed)
Pt refused covid testing. She stated she already has the medication so she is content with that.

## 2022-06-27 LAB — SARS CORONAVIRUS 2 (TAT 6-24 HRS): SARS Coronavirus 2: NEGATIVE

## 2022-08-20 NOTE — Progress Notes (Signed)
NEUROLOGY FOLLOW UP OFFICE NOTE  Terin Bowdoin LA:[id number]  Assessment/Plan:   Migraine without aura, without status migrainosus, not intractable   Migraine prevention:  topiramate to '100mg'$  at bedtime Migraine rescue:  Ubrelvy '100mg'$  Limit use of pain relievers to no more than 2 days out of week to prevent risk of rebound or medication-overuse headache. Keep headache diary Discussed lifestyle modification Follow up 6 months       Subjective:  Emily [name] is 22 year old female with depression, anxiety and history of febrile seizure who follows up for migraines.  UPDATE: Last appointment, we increased topiramate.   Improved.  Realized dairy is a trigger.  Roselyn Meier initially had a reaction but adapted and is effective.  Intensity:  moderate Duration:  15 minutes with Roselyn Meier but may return later at night. Frequency:  3-4 days a month Frequency of abortive medication: no more than 4 days a week Rescue protocol:  rizatriptan Current NSAIDS/analgesics:  none Current triptans:  rizatriptan '10mg'$  (sometimes causes migraines to last longer) Current ergotamine:  none Current anti-emetic:  none Current muscle relaxants:  none Current Antihypertensive medications:  none Current Antidepressant medications:  escitalopram '10mg'$  Current Anticonvulsant medications:  topiramate '100mg'$  BID Current anti-CGRP:  none Current Vitamins/Herbal/Supplements:  D Current Antihistamines/Decongestants:  loratidine Other therapy:  none Hormone/birth control:  Nexplanon     Caffeine:  Dr. Malachi Bonds.  No coffee Diet:  30 oz water daily.  Soda (zero-sugar and sugar).  Skips meals Exercise:  no Depression:  stable; Anxiety:  yes Other pain:  no Sleep hygiene:  varies.  Sometimes stays up late and falls asleep at midnight and wakes up at 10 AM  HISTORY:  Onset:  48-68 years old Location:  varies - unilateral (either side), forehead Quality:  stabbing, throbbing Intensity:  severe Aura:   absent Prodrome:  absent Associated symptoms:  nausea, photophobia, phonophobia, osmophobia.  She denies associated vomiting, unilateral numbness or weakness. Duration:  all day Frequency:  2 days a week Frequency of abortive medication: 2 days a week Triggers:  hormones (improved on Nexplanon), stress, too much sleep, sleep deprivation, hair tied too tight, dairy, sweets, coffee Relieving factors:  sleep, cold rag on forehead, laying in dark Activity:  activity aggravates headache   Past NSAIDS/analgesics:  ibuprofen, naproxen, Excedrin Past abortive triptans:  sumatriptan tab, rizatriptan  Past abortive ergotamine:  none Past muscle relaxants:  none Past anti-emetic:  ondansetron '4mg'$  Past antihypertensive medications:  none Past antidepressant medications:  none Past anticonvulsant medications:  none Past anti-CGRP: none Past vitamins/Herbal/Supplements:  none Past antihistamines/decongestants:  cetirizine  Other past therapies:  none    Family history of headache:  no  PAST MEDICAL HISTORY: Past Medical History:  Diagnosis Date   Anxiety    [employer]reesia 03/29/2020   Depression    [employer]reesia 03/29/2020   Febrile seizure ([place]) 12/2003   Migraines    UTI (urinary tract infection) 12/2003   Renal US WNL    MEDICATIONS: Current Outpatient Medications on File Prior to Visit  Medication Sig Dispense Refill   azithromycin (ZITHROMAX) 250 MG tablet Take 1 tablet (250 mg total) by mouth daily. Take first 2 tablets together, then 1 every day until finished. 6 tablet 0   escitalopram (LEXAPRO) 10 MG tablet Take 1 tablet (10 mg total) by mouth at bedtime. 90 tablet 3   fluticasone (FLONASE) 50 MCG/ACT nasal spray Place 1 spray into both nostrils 2 (two) times daily. 16 g 2   loratadine (CLARITIN) 10 MG tablet Take  10 mg by mouth daily.     NEXPLANON 68 MG IMPL implant      ondansetron (ZOFRAN) 4 MG tablet Take 4 mg by mouth every 8 (eight) hours as needed for nausea or vomiting.      promethazine-dextromethorphan (PROMETHAZINE-DM) 6.25-15 MG/5ML syrup Take 5 mLs by mouth 4 (four) times daily as needed for cough. 118 mL 0   promethazine-dextromethorphan (PROMETHAZINE-DM) 6.25-15 MG/5ML syrup Take 5 mLs by mouth 4 (four) times daily as needed. 100 mL 0   rizatriptan (MAXALT) 10 MG tablet Take 1 tablet (10 mg total) by mouth as needed for migraine. May repeat in 2 hours if needed. Maximum 2 per day. 10 tablet 2   topiramate (TOPAMAX) 50 MG tablet Take 2 tablets (100 mg total) by mouth at bedtime. 60 tablet 5   Ubrogepant (UBRELVY) 100 MG TABS Samples of this drug were given to the patient, quantity 4, Lot Number [id number] exp 02/2024 30 tablet 0   Vitamin D, Ergocalciferol, (DRISDOL) 1.25 MG (50000 UNIT) CAPS capsule Take 1 capsule (50,000 Units total) by mouth every 7 (seven) days. 12 capsule 1   No current facility-administered medications on file prior to visit.    ALLERGIES: No Known Allergies  FAMILY HISTORY: Family History  Problem Relation Age of Onset   Healthy Mother    Healthy Father       Objective:  Blood pressure 118/69, pulse 92, height '5\' 6"'$  (1.676 m), weight 273 lb 12.8 oz (124.2 kg), SpO2 98 %. General: No acute distress.  Patient appears well-groomed.     Metta Clines, DO  CC: Ihor Dow, MD

## 2022-08-22 ENCOUNTER — Encounter: Payer: Self-pay | Admitting: Neurology

## 2022-08-22 ENCOUNTER — Ambulatory Visit (INDEPENDENT_AMBULATORY_CARE_PROVIDER_SITE_OTHER): Payer: Self-pay | Admitting: Neurology

## 2022-08-22 VITALS — BP 118/69 | HR 92 | Ht 66.0 in | Wt 273.8 lb

## 2022-08-22 DIAGNOSIS — G43009 Migraine without aura, not intractable, without status migrainosus: Secondary | ICD-10-CM

## 2022-08-22 MED ORDER — TOPIRAMATE 100 MG PO TABS: 100.0000 mg | ORAL_TABLET | Freq: Every day | ORAL | 11 refills | Status: DC

## 2022-08-22 MED ORDER — UBRELVY 100 MG PO TABS: 1.0000 | ORAL_TABLET | ORAL | 11 refills | Status: DC | PRN

## 2022-08-22 NOTE — Patient Instructions (Signed)
Continue topiramate '100mg'$  at bedtime Ubrelvy as needed/instructed Follow up 6 months.

## 2022-09-05 ENCOUNTER — Encounter: Payer: Self-pay | Admitting: Internal Medicine

## 2022-09-05 ENCOUNTER — Ambulatory Visit (INDEPENDENT_AMBULATORY_CARE_PROVIDER_SITE_OTHER): Payer: Self-pay | Admitting: Internal Medicine

## 2022-09-05 VITALS — BP 109/74 | HR 96 | Ht 66.0 in | Wt 270.0 lb

## 2022-09-05 DIAGNOSIS — F3341 Major depressive disorder, recurrent, in partial remission: Secondary | ICD-10-CM | POA: Insufficient documentation

## 2022-09-05 DIAGNOSIS — F411 Generalized anxiety disorder: Secondary | ICD-10-CM

## 2022-09-05 DIAGNOSIS — E559 Vitamin D deficiency, unspecified: Secondary | ICD-10-CM

## 2022-09-05 DIAGNOSIS — N926 Irregular menstruation, unspecified: Secondary | ICD-10-CM

## 2022-09-05 DIAGNOSIS — G43719 Chronic migraine without aura, intractable, without status migrainosus: Secondary | ICD-10-CM

## 2022-09-05 DIAGNOSIS — F332 Major depressive disorder, recurrent severe without psychotic features: Secondary | ICD-10-CM

## 2022-09-05 MED ORDER — ESCITALOPRAM OXALATE 20 MG PO TABS: 20.0000 mg | ORAL_TABLET | Freq: Every day | ORAL | 1 refills | Status: DC

## 2022-09-05 NOTE — Assessment & Plan Note (Signed)
Was given Imitrex, but did not help much Maxalt for breakthrough headache Required frequent use of Maxalt, started prophylactic treatment - Topiramate, increased dose to 25 mg BID Referred to Neurology - increased dose of Topiramate to 100 mg qHS

## 2022-09-05 NOTE — Assessment & Plan Note (Addendum)
Uncontrolled with Lexapro 10 mg QD currently Likely due to workplace related stress Increased dose of Lexapro to 20 mg QD

## 2022-09-05 NOTE — Assessment & Plan Note (Signed)
Last vitamin D Lab Results  Component Value Date   VD25OH 12.7 (L) 03/06/2022   Advised to take Vitamin D 5000 IU QD

## 2022-09-05 NOTE — Assessment & Plan Note (Signed)
Likely due to recent Nexplanon insertion Referred to OB/GYN as she has persistent concern

## 2022-09-05 NOTE — Patient Instructions (Signed)
Please start taking Lexapro 20 mg once daily.  Please continue taking other medications as prescribed.  Please continue to follow low carb diet and perform moderate exercise/walking at least 150 mins/week.

## 2022-09-05 NOTE — Assessment & Plan Note (Signed)
[place] Office Visit from 09/05/2022 in [facility]  PHQ-9 Total Score 13      Uncontrolled with Lexapro 10 mg daily Increased dose of Lexapro to 20 mg QD Referred to [facility] therapy Provided information of [facility]/urgent care in [place] for mental health emergency

## 2022-09-05 NOTE — Progress Notes (Addendum)
Established Patient Office Visit  Subjective:  Patient ID: Emily [name], female    DOB: [birth year]  Age: 22 y.o. MRN: [id number]  CC:  Chief Complaint  Patient presents with   Migraine    Six month follow up for migraines and GAD    HPI Emily [name] is a 22 y.o. female with past medical history of GAD, migraine, morbid obesity and seasonal allergies who presents for f/u of her chronic medical conditions.  Migraines: She has noticed movement in her migraine frequency with topiramate 100 mg nightly.  She takes Bernita Raisin as needed for breakthrough headache.  Denies any nausea or vomiting currently.  GAD/MDD: She has been stressed at work.  She states that her work hours have been cut due to her being slow.  She also reports stress/drama at home, but does not specify any further.  She has a patchy, chronic fatigue, insomnia, decreased concentration and moving slow lately.  She is currently taking Lexapro 10 mg daily.  She has had suicidal thoughts in the past, but denies any recently.  Denies any suicidal plan currently.    Past Medical History:  Diagnosis Date   Anxiety    Phreesia 03/29/2020   Depression    Phreesia 03/29/2020   Febrile seizure (HCC) 12/2003   Migraines    UTI (urinary tract infection) 12/2003   Renal US WNL    History reviewed. No pertinent surgical history.  Family History  Problem Relation Age of Onset   Healthy Mother    Healthy Father     Social History   Socioeconomic History   Marital status: Single    Spouse name: Not on file   Number of children: Not on file   Years of education: Not on file   Highest education level: Not on file  Occupational History   Not on file  Tobacco Use   Smoking status: Never    Passive exposure: Yes   Smokeless tobacco: Never  Vaping Use   Vaping Use: Never used  Substance and Sexual Activity   Alcohol use: Never   Drug use: Never   Sexual activity: Never    Partners: Female    Birth control/protection:  Implant  Other Topics Concern   Not on file  Social History Narrative   Not on file   Social Determinants of Health   Financial Resource Strain: Not on file  Food Insecurity: Not on file  Transportation Needs: Not on file  Physical Activity: Not on file  Stress: Not on file  Social Connections: Not on file  Intimate Partner Violence: Not on file    Outpatient Medications Prior to Visit  Medication Sig Dispense Refill   loratadine (CLARITIN) 10 MG tablet Take 10 mg by mouth daily.     NEXPLANON 68 MG IMPL implant      ondansetron (ZOFRAN) 4 MG tablet Take 4 mg by mouth every 8 (eight) hours as needed for nausea or vomiting.     topiramate (TOPAMAX) 100 MG tablet Take 1 tablet (100 mg total) by mouth at bedtime. 30 tablet 11   Ubrogepant (UBRELVY) 100 MG TABS Take 1 tablet (100 mg total) by mouth as needed. May repeat after 2 hours.  Maximum 2 tablets in 24 hours. 10 tablet 11   azithromycin (ZITHROMAX) 250 MG tablet Take 1 tablet (250 mg total) by mouth daily. Take first 2 tablets together, then 1 every day until finished. 6 tablet 0   escitalopram (LEXAPRO) 10 MG tablet Take 1 tablet (10  mg total) by mouth at bedtime. 90 tablet 3   No facility-administered medications prior to visit.    No Known Allergies  ROS Review of Systems  Constitutional:  Negative for chills and fever.  HENT:  Negative for congestion, sinus pressure, sinus pain and sore throat.   Eyes:  Negative for pain and discharge.  Respiratory:  Negative for cough and shortness of breath.   Cardiovascular:  Negative for chest pain and palpitations.  Gastrointestinal:  Negative for abdominal pain, constipation, diarrhea, nausea and vomiting.  Endocrine: Negative for polydipsia and polyuria.  Genitourinary:  Positive for menstrual problem. Negative for dysuria and hematuria.  Musculoskeletal:  Negative for neck pain and neck stiffness.  Skin:  Negative for rash.  Neurological:  Positive for headaches. Negative  for dizziness and weakness.  Psychiatric/Behavioral:  Positive for decreased concentration, dysphoric mood and sleep disturbance. Negative for agitation and behavioral problems. The patient is nervous/anxious.       Objective:    Physical Exam Vitals reviewed.  Constitutional:      General: She is not in acute distress.    Appearance: She is obese. She is not diaphoretic.  HENT:     Head: Normocephalic and atraumatic.     Nose: Nose normal.     Mouth/Throat:     Mouth: Mucous membranes are moist.  Eyes:     General: No scleral icterus.    Extraocular Movements: Extraocular movements intact.  Cardiovascular:     Rate and Rhythm: Normal rate and regular rhythm.     Pulses: Normal pulses.     Heart sounds: Normal heart sounds. No murmur heard. Pulmonary:     Breath sounds: Normal breath sounds. No wheezing or rales.  Musculoskeletal:     Cervical back: Neck supple. No tenderness.     Right lower leg: No edema.     Left lower leg: No edema.  Skin:    General: Skin is warm.     Findings: No rash.  Neurological:     General: No focal deficit present.     Mental Status: She is alert and oriented to person, place, and time.     Sensory: No sensory deficit.     Motor: No weakness.  Psychiatric:        Mood and Affect: Mood is depressed. Affect is tearful.        Behavior: Behavior is slowed.        Thought Content: Thought content does not include homicidal or suicidal ideation.     BP 109/74 (BP Location: Right Arm, Patient Position: Sitting, Cuff Size: Large)   Pulse 96   Ht 5\' 6"  (1.676 m)   Wt 270 lb (122.5 kg)   SpO2 97%   BMI 43.58 kg/m  Wt Readings from Last 3 Encounters:  09/05/22 270 lb (122.5 kg)  08/22/22 273 lb 12.8 oz (124.2 kg)  03/21/22 278 lb 6.4 oz (126.3 kg) (>99 %, Z= 2.74)*   * Growth percentiles are based on CDC (Girls, 2-20 Years) data.    Lab Results  Component Value Date   TSH 1.290 03/06/2022   Lab Results  Component Value Date   WBC  7.5 03/06/2022   HGB 13.9 03/06/2022   HCT 42.7 03/06/2022   MCV 93 03/06/2022   PLT 287 03/06/2022   Lab Results  Component Value Date   NA 136 03/06/2022   K 4.3 03/06/2022   CO2 19 (L) 03/06/2022   GLUCOSE 92 03/06/2022   BUN 9 03/06/2022  CREATININE 0.64 03/06/2022   BILITOT 0.3 03/06/2022   ALKPHOS 32 (L) 03/06/2022   AST 12 03/06/2022   ALT 13 03/06/2022   PROT 7.2 03/06/2022   ALBUMIN 4.8 03/06/2022   CALCIUM 9.7 03/06/2022   EGFR 130 03/06/2022   Lab Results  Component Value Date   CHOL 152 03/06/2022   Lab Results  Component Value Date   HDL 37 (L) 03/06/2022   Lab Results  Component Value Date   LDLCALC 86 03/06/2022   Lab Results  Component Value Date   TRIG 170 (H) 03/06/2022   Lab Results  Component Value Date   CHOLHDL 4.1 03/06/2022   Lab Results  Component Value Date   HGBA1C 4.6 08/31/2019      Assessment & Plan:   Problem List Items Addressed This Visit       Cardiovascular and Mediastinum   Intractable chronic migraine without aura and without status migrainosus - Primary    Was given Imitrex, but did not help much Maxalt for breakthrough headache Required frequent use of Maxalt, started prophylactic treatment - Topiramate, increased dose to 25 mg BID Referred to Neurology - increased dose of Topiramate to 100 mg qHS      Relevant Medications   escitalopram (LEXAPRO) 20 MG tablet     Other   Generalized anxiety disorder    Uncontrolled with Lexapro 10 mg QD currently Likely due to workplace related stress Increased dose of Lexapro to 20 mg QD      Relevant Medications   escitalopram (LEXAPRO) 20 MG tablet   Irregular menstrual bleeding    Likely due to recent Nexplanon insertion Referred to OB/GYN as she has persistent concern      Relevant Orders   Ambulatory referral to Obstetrics / Gynecology   Vitamin D deficiency    Last vitamin D Lab Results  Component Value Date   VD25OH 12.7 (L) 03/06/2022  Advised to  take Vitamin D 5000 IU QD      Severe episode of recurrent major depressive disorder, without psychotic features (HCC)    Flowsheet Row Office Visit from 09/05/2022 in [facility]  PHQ-9 Total Score 13     Uncontrolled with Lexapro 10 mg daily Increased dose of Lexapro to 20 mg QD Referred to [facility] therapy Provided information of [facility]/urgent care in [place] for mental health emergency       Relevant Medications   escitalopram (LEXAPRO) 20 MG tablet   Other Relevant Orders   Ambulatory referral to Psychiatry    Meds ordered this encounter  Medications   escitalopram (LEXAPRO) 20 MG tablet    Sig: Take 1 tablet (20 mg total) by mouth at bedtime.    Dispense:  90 tablet    Refill:  1    Dose change    Follow-up: Return in about 3 months (around 12/06/2022) for MDD.    Anabel Halon, MD

## 2022-09-15 ENCOUNTER — Encounter: Payer: Self-pay | Admitting: Women's Health

## 2022-11-07 ENCOUNTER — Other Ambulatory Visit: Payer: Self-pay | Admitting: Internal Medicine

## 2022-11-07 DIAGNOSIS — F411 Generalized anxiety disorder: Secondary | ICD-10-CM

## 2022-12-08 ENCOUNTER — Ambulatory Visit: Payer: Self-pay | Admitting: Internal Medicine

## 2023-02-26 NOTE — Progress Notes (Signed)
Virtual Visit via Video Note  Consent was obtained for video visit:  Yes.   Answered questions that patient had about telehealth interaction:  Yes.   I discussed the limitations, risks, security and privacy concerns of performing an evaluation and management service by telemedicine. I also discussed with the patient that there may be a patient responsible charge related to this service. The patient expressed understanding and agreed to proceed.  Pt location: Home Physician Location: office Name of referring provider:  Anabel Halon, MD I connected with Emily [name] at patients initiation/request on 02/27/2023 at  8:30 AM EDT by video enabled telemedicine application and verified that I am speaking with the correct person using two identifiers. Pt MRN:  [id number] Pt DOB:  [birth year] Video Participants:  Emily [name]   Assessment/Plan:    Migraine without aura, without status migrainosus, not intractable   Migraine prevention:  Restart topiramate 25mg  at bedtime for one week, then 50mg  at bedtime for one week, then 100mg  at bedtime Migraine rescue:  Bernita Raisin 100mg  Limit use of pain relievers to no more than 2 days out of week to prevent risk of rebound or medication-overuse headache. Keep headache diary Discussed lifestyle modification Follow up 6-7 months       Subjective:  Emily [name] is 22 year old female with depression, anxiety and history of febrile seizure who follows up for migraines.   UPDATE: She hasn't been on topiramate because she lost insurance and couldn't afford it.  It has been about 1 or 2 months.  Since then, she has still been doing well but would like to restart topiramate  Intensity:  moderate Duration:  15 minutes with Bernita Raisin  Frequency:  4 days a month Frequency of abortive medication: no more than 4 days a week Rescue protocol:  rizatriptan Current NSAIDS/analgesics:  none Current triptans:  rizatriptan 10mg  (sometimes causes migraines to last  longer) Current ergotamine:  none Current anti-emetic:  none Current muscle relaxants:  none Current Antihypertensive medications:  none Current Antidepressant medications:  escitalopram 10mg  Current Anticonvulsant medications:  topiramate 100mg  BID Current anti-CGRP:  none Current Vitamins/Herbal/Supplements:  D Current Antihistamines/Decongestants:  loratidine Other therapy:  none Hormone/birth control:  Nexplanon     Caffeine:  Dr. Reino Kent.  No coffee Diet:  30 oz water daily.  Soda (zero-sugar and sugar).  Skips meals Exercise:  no Depression:  stable; Anxiety:  yes Other pain:  no Sleep hygiene:  varies.  Sometimes stays up late and [name] asleep at midnight and wakes up at 10 AM   HISTORY:  Onset:  90-2 years old Location:  varies - unilateral (either side), forehead Quality:  stabbing, throbbing Intensity:  severe Aura:  absent Prodrome:  absent Associated symptoms:  nausea, photophobia, phonophobia, osmophobia.  She denies associated vomiting, unilateral numbness or weakness. Duration:  all day Frequency:  2 days a week Frequency of abortive medication: 2 days a week Triggers:  hormones (improved on Nexplanon), stress, too much sleep, sleep deprivation, hair tied too tight, dairy, sweets, coffee Relieving factors:  sleep, cold rag on forehead, laying in dark Activity:  activity aggravates headache   Past NSAIDS/analgesics:  ibuprofen, naproxen, Excedrin Past abortive triptans:  sumatriptan tab, rizatriptan  Past abortive ergotamine:  none Past muscle relaxants:  none Past anti-emetic:  ondansetron 4mg  Past antihypertensive medications:  none Past antidepressant medications:  none Past anticonvulsant medications:  none Past anti-CGRP: none Past vitamins/Herbal/Supplements:  none Past antihistamines/decongestants:  cetirizine  Other past therapies:  none  Family history of headache:  no  Past Medical History: Past Medical History:  Diagnosis Date   Anxiety     Phreesia 03/29/2020   Depression    Phreesia 03/29/2020   Febrile seizure (HCC) 12/2003   Migraines    UTI (urinary tract infection) 12/2003   Renal US WNL    Medications: Outpatient Encounter Medications as of 02/27/2023  Medication Sig   escitalopram (LEXAPRO) 10 MG tablet TAKE (1) TABLET BY MOUTH AT BEDTIME.   escitalopram (LEXAPRO) 20 MG tablet Take 1 tablet (20 mg total) by mouth at bedtime.   loratadine (CLARITIN) 10 MG tablet Take 10 mg by mouth daily.   NEXPLANON 68 MG IMPL implant    ondansetron (ZOFRAN) 4 MG tablet Take 4 mg by mouth every 8 (eight) hours as needed for nausea or vomiting.   topiramate (TOPAMAX) 100 MG tablet Take 1 tablet (100 mg total) by mouth at bedtime.   Ubrogepant (UBRELVY) 100 MG TABS Take 1 tablet (100 mg total) by mouth as needed. May repeat after 2 hours.  Maximum 2 tablets in 24 hours.   No facility-administered encounter medications on file as of 02/27/2023.    Allergies: No Known Allergies  Family History: Family History  Problem Relation Age of Onset   Healthy Mother    Healthy Father     Observations/Objective:   No acute distress.  Alert and oriented.  Speech fluent and not dysarthric.  Language intact.   Follow Up Instructions:    -I discussed the assessment and treatment plan with the patient. The patient was provided an opportunity to ask questions and all were answered. The patient agreed with the plan and demonstrated an understanding of the instructions.   The patient was advised to call back or seek an in-person evaluation if the symptoms worsen or if the condition fails to improve as anticipated.   Cira Servant, DO

## 2023-02-27 ENCOUNTER — Encounter: Payer: Self-pay | Admitting: Neurology

## 2023-02-27 ENCOUNTER — Telehealth (INDEPENDENT_AMBULATORY_CARE_PROVIDER_SITE_OTHER): Payer: Medicaid Other | Admitting: Neurology

## 2023-02-27 DIAGNOSIS — G43009 Migraine without aura, not intractable, without status migrainosus: Secondary | ICD-10-CM | POA: Diagnosis not present

## 2023-02-27 MED ORDER — UBRELVY 100 MG PO TABS: 1.0000 | ORAL_TABLET | ORAL | 11 refills | Status: DC | PRN

## 2023-02-27 MED ORDER — TOPIRAMATE 50 MG PO TABS: ORAL_TABLET | ORAL | 0 refills | Status: DC

## 2023-02-27 NOTE — Patient Instructions (Signed)
Restart topiramate as directed Ubrelvy as needed Follow up 6-7 months.

## 2023-04-21 ENCOUNTER — Other Ambulatory Visit: Payer: Self-pay

## 2023-04-21 DIAGNOSIS — F411 Generalized anxiety disorder: Secondary | ICD-10-CM

## 2023-04-21 MED ORDER — ESCITALOPRAM OXALATE 10 MG PO TABS: 10.0000 mg | ORAL_TABLET | Freq: Every day | ORAL | 0 refills | Status: DC

## 2023-04-23 ENCOUNTER — Other Ambulatory Visit: Payer: Self-pay | Admitting: Neurology

## 2023-04-23 ENCOUNTER — Other Ambulatory Visit: Payer: Self-pay

## 2023-04-23 MED ORDER — TOPIRAMATE 50 MG PO TABS: ORAL_TABLET | ORAL | 5 refills | Status: DC

## 2023-04-28 ENCOUNTER — Ambulatory Visit: Payer: Medicaid Other | Admitting: Internal Medicine

## 2023-04-28 ENCOUNTER — Encounter: Payer: Self-pay | Admitting: Internal Medicine

## 2023-04-28 VITALS — BP 117/81 | HR 96 | Ht 69.0 in | Wt 267.0 lb

## 2023-04-28 DIAGNOSIS — E559 Vitamin D deficiency, unspecified: Secondary | ICD-10-CM

## 2023-04-28 DIAGNOSIS — G43719 Chronic migraine without aura, intractable, without status migrainosus: Secondary | ICD-10-CM | POA: Diagnosis not present

## 2023-04-28 DIAGNOSIS — Z0001 Encounter for general adult medical examination with abnormal findings: Secondary | ICD-10-CM

## 2023-04-28 DIAGNOSIS — Z3046 Encounter for surveillance of implantable subdermal contraceptive: Secondary | ICD-10-CM

## 2023-04-28 DIAGNOSIS — F411 Generalized anxiety disorder: Secondary | ICD-10-CM

## 2023-04-28 DIAGNOSIS — N926 Irregular menstruation, unspecified: Secondary | ICD-10-CM

## 2023-04-28 DIAGNOSIS — Z23 Encounter for immunization: Secondary | ICD-10-CM

## 2023-04-28 DIAGNOSIS — F3341 Major depressive disorder, recurrent, in partial remission: Secondary | ICD-10-CM

## 2023-04-28 DIAGNOSIS — F332 Major depressive disorder, recurrent severe without psychotic features: Secondary | ICD-10-CM

## 2023-04-28 NOTE — Assessment & Plan Note (Addendum)
BMI Readings from Last 3 Encounters:  04/28/23 39.43 kg/m  09/05/22 43.58 kg/m  08/22/22 44.19 kg/m   Has been losing weight with diet modification Advised to follow low-carb diet and perform moderate exercise at least 150 minutes/week

## 2023-04-28 NOTE — Assessment & Plan Note (Signed)
Was given Maxalt and Imitrex, but did not help much Ubrelvy for breakthrough headache Required frequent use of Maxalt, started prophylactic treatment - Topiramate Has been evaluated by neurology - increased dose of Topiramate to 50 mg twice daily

## 2023-04-28 NOTE — Patient Instructions (Signed)
Please continue to take medications as prescribed. ? ?Please continue to follow low carb diet and perform moderate exercise/walking at least 150 mins/week. ?

## 2023-04-28 NOTE — Progress Notes (Signed)
Established Patient Office Visit  Subjective:  Patient ID: Emily [name], female    DOB: 04-26-2001  Age: 22 y.o. MRN: [id number]  CC:  Chief Complaint  Patient presents with   Migraine    Follow up    Referral    Referral to OBGYN for birth control removal    Annual Exam    HPI Emily [name] is a 22 y.o. female with past medical history of GAD, migraine, morbid obesity and seasonal allergies who presents for f/u of annual physical.  Migraines: She has noticed movement in her migraine frequency with topiramate 50 mg BID.  She takes Bernita Raisin as needed for breakthrough headache.  Denies any nausea or vomiting currently. Follows up with Neurology.  GAD/MDD:  She is currently taking Lexapro 10 mg daily.  Her dose of Lexapro was increased in the last visit to 20 mg QD, but she preferred to stay on 10 mg.  She had been stressed at work, but has left that job now.  She also reports stress/drama at home, but has been better now. She lives with her girlfriend, mother and grandmother. She has had suicidal thoughts in the past, but denies any recently.  Denies any suicidal plan currently.  She still has heavy menstrual cycles.  She has not been able to see OB/GYN yet for Nexplanon removal.  Denies any vaginal discharge or itching.   Past Medical History:  Diagnosis Date   Anxiety    Phreesia 03/29/2020   Depression    Phreesia 03/29/2020   Febrile seizure (HCC) 12/2003   Migraines    UTI (urinary tract infection) 12/2003   Renal US WNL    History reviewed. No pertinent surgical history.  Family History  Problem Relation Age of Onset   Healthy Mother    Healthy Father     Social History   Socioeconomic History   Marital status: Single    Spouse name: Not on file   Number of children: Not on file   Years of education: Not on file   Highest education level: Not on file  Occupational History   Not on file  Tobacco Use   Smoking status: Never    Passive exposure: Yes    Smokeless tobacco: Never  Vaping Use   Vaping status: Never Used  Substance and Sexual Activity   Alcohol use: Never   Drug use: Never   Sexual activity: Never    Partners: Female    Birth control/protection: Implant  Other Topics Concern   Not on file  Social History Narrative   Not on file   Social Determinants of Health   Financial Resource Strain: Not on file  Food Insecurity: Not on file  Transportation Needs: Not on file  Physical Activity: Insufficiently Active (05/28/2021)   Received from [facility], [facility]   Exercise Vital Sign    Days of Exercise per Week: 1 day    Minutes of Exercise per Session: 70 min  Stress: No Stress Concern Present (05/28/2021)   Received from [facility], [facility] - Occupational Stress Questionnaire    Feeling of Stress : Only a little  Social Connections: Not on file  Intimate Partner Violence: Not At Risk (05/28/2021)   Received from [facility], [facility]   Humiliation, Afraid, Rape, and Kick questionnaire    Fear of Current or Ex-Partner: No    Emotionally Abused: No    Physically Abused:  No    Sexually Abused: No    Outpatient Medications Prior to Visit  Medication Sig Dispense Refill   escitalopram (LEXAPRO) 10 MG tablet Take 1 tablet (10 mg total) by mouth at bedtime. 90 tablet 0   famotidine (PEPCID) 10 MG tablet Take 10 mg by mouth 2 (two) times daily.     loratadine (CLARITIN) 10 MG tablet Take 10 mg by mouth daily.     NEXPLANON 68 MG IMPL implant      ondansetron (ZOFRAN) 4 MG tablet Take 4 mg by mouth every 8 (eight) hours as needed for nausea or vomiting.     topiramate (TOPAMAX) 50 MG tablet 2 tablets at bedtime 60 tablet 5   Ubrogepant (UBRELVY) 100 MG TABS Take 1 tablet (100 mg total) by mouth as needed. May repeat after 2 hours.  Maximum 2 tablets in 24 hours. 10 tablet 11   No facility-administered medications prior to visit.    No Known  Allergies  ROS Review of Systems  Constitutional:  Negative for chills and fever.  HENT:  Negative for congestion, sinus pressure, sinus pain and sore throat.   Eyes:  Negative for pain and discharge.  Respiratory:  Negative for cough and shortness of breath.   Cardiovascular:  Negative for chest pain and palpitations.  Gastrointestinal:  Negative for abdominal pain, constipation, diarrhea, nausea and vomiting.  Endocrine: Negative for polydipsia and polyuria.  Genitourinary:  Positive for menstrual problem. Negative for dysuria and hematuria.  Musculoskeletal:  Negative for neck pain and neck stiffness.  Skin:  Negative for rash.  Neurological:  Positive for headaches. Negative for dizziness and weakness.  Psychiatric/Behavioral:  Positive for dysphoric mood. Negative for agitation and behavioral problems.       Objective:    Physical Exam Vitals reviewed.  Constitutional:      General: She is not in acute distress.    Appearance: She is obese. She is not diaphoretic.  HENT:     Head: Normocephalic and atraumatic.     Nose: Nose normal.     Mouth/Throat:     Mouth: Mucous membranes are moist.  Eyes:     General: No scleral icterus.    Extraocular Movements: Extraocular movements intact.  Cardiovascular:     Rate and Rhythm: Normal rate and regular rhythm.     Pulses: Normal pulses.     Heart sounds: Normal heart sounds. No murmur heard. Pulmonary:     Breath sounds: Normal breath sounds. No wheezing or rales.  Musculoskeletal:     Cervical back: Neck supple. No tenderness.     Right lower leg: No edema.     Left lower leg: No edema.  Skin:    General: Skin is warm.     Findings: No rash.  Neurological:     General: No focal deficit present.     Mental Status: She is alert and oriented to person, place, and time.     Sensory: No sensory deficit.     Motor: No weakness.  Psychiatric:        Mood and Affect: Mood normal.        Behavior: Behavior is slowed.         Thought Content: Thought content does not include homicidal or suicidal ideation.     BP 117/81 (BP Location: Right Arm, Patient Position: Sitting, Cuff Size: Large)   Pulse 96   Ht 5\' 9"  (1.753 m)   Wt 267 lb (121.1 kg)   SpO2 98%  BMI 39.43 kg/m  Wt Readings from Last 3 Encounters:  04/28/23 267 lb (121.1 kg)  09/05/22 270 lb (122.5 kg)  08/22/22 273 lb 12.8 oz (124.2 kg)    Lab Results  Component Value Date   TSH 1.290 03/06/2022   Lab Results  Component Value Date   WBC 8.7 04/28/2023   HGB 14.6 04/28/2023   HCT 43.3 04/28/2023   MCV 96 04/28/2023   PLT 282 04/28/2023   Lab Results  Component Value Date   NA 138 04/28/2023   K 4.7 04/28/2023   CO2 17 (L) 04/28/2023   GLUCOSE 81 04/28/2023   BUN 8 04/28/2023   CREATININE 0.70 04/28/2023   BILITOT 0.4 04/28/2023   ALKPHOS 34 (L) 04/28/2023   AST 24 04/28/2023   ALT 12 04/28/2023   PROT 7.5 04/28/2023   ALBUMIN 4.5 04/28/2023   CALCIUM 9.4 04/28/2023   EGFR 126 04/28/2023   Lab Results  Component Value Date   CHOL 152 03/06/2022   Lab Results  Component Value Date   HDL 37 (L) 03/06/2022   Lab Results  Component Value Date   LDLCALC 86 03/06/2022   Lab Results  Component Value Date   TRIG 170 (H) 03/06/2022   Lab Results  Component Value Date   CHOLHDL 4.1 03/06/2022   Lab Results  Component Value Date   HGBA1C 4.6 08/31/2019      Assessment & Plan:   Problem List Items Addressed This Visit       Cardiovascular and Mediastinum   Intractable chronic migraine without aura and without status migrainosus    Was given Maxalt and Imitrex, but did not help much Ubrelvy for breakthrough headache Required frequent use of Maxalt, started prophylactic treatment - Topiramate Has been evaluated by neurology - increased dose of Topiramate to 50 mg twice daily        Other   Generalized anxiety disorder    Well-controlled with Lexapro 10 mg QD currently Was likely worse in the past due to  workplace related stress      Morbid obesity (HCC)    BMI Readings from Last 3 Encounters:  04/28/23 39.43 kg/m  09/05/22 43.58 kg/m  08/22/22 44.19 kg/m   Has been losing weight with diet modification Advised to follow low-carb diet and perform moderate exercise at least 150 minutes/week      Irregular menstrual bleeding    Likely due to recent Nexplanon insertion Referred to OB/GYN as she has persistent concern      Encounter for general adult medical examination with abnormal findings - Primary    Physical exam as documented. Counseling done  re healthy lifestyle involving commitment to 150 minutes exercise per week, heart healthy diet, and attaining healthy weight.The importance of adequate sleep also discussed. Immunization and cancer screening needs are specifically addressed at this visit.      Vitamin D deficiency    Last vitamin D Lab Results  Component Value Date   VD25OH 10.1 (L) 04/28/2023   Started vitamin D 50,000 IU QW      Relevant Orders   Vitamin D (25 hydroxy) (Completed)   Recurrent major depressive disorder in partial remission (HCC)    Flowsheet Row Office Visit from 04/28/2023 in [facility]  PHQ-9 Total Score 12      Uncontrolled, but improving On Lexapro 10 mg QD Had referred to [facility] therapy Provided information of [facility]/urgent care in [place] for mental health emergency  Other Visit Diagnoses     Nexplanon removal       Relevant Orders   Ambulatory referral to Obstetrics / Gynecology   Encounter for immunization       Relevant Orders   Tdap vaccine greater than or equal to 7yo IM (Completed)        No orders of the defined types were placed in this encounter.   Follow-up: Return in about 6 months (around 10/26/2023) for MDD.    Anabel Halon, MD

## 2023-04-28 NOTE — Assessment & Plan Note (Addendum)
Flowsheet Row Office Visit from 04/28/2023 in [facility]  PHQ-9 Total Score 12      Uncontrolled, but improving On Lexapro 10 mg QD Had referred to [facility] therapy Provided information of [facility]/urgent care in [place] for mental health emergency

## 2023-04-28 NOTE — Assessment & Plan Note (Addendum)
Physical exam as documented. Counseling done  re healthy lifestyle involving commitment to 150 minutes exercise per week, heart healthy diet, and attaining healthy weight.The importance of adequate sleep also discussed. Immunization and cancer screening needs are specifically addressed at this visit.

## 2023-04-30 ENCOUNTER — Other Ambulatory Visit: Payer: Self-pay | Admitting: Internal Medicine

## 2023-04-30 ENCOUNTER — Other Ambulatory Visit (HOSPITAL_COMMUNITY): Payer: Self-pay

## 2023-04-30 DIAGNOSIS — E559 Vitamin D deficiency, unspecified: Secondary | ICD-10-CM

## 2023-04-30 LAB — CMP14+EGFR
ALT: 12 IU/L (ref 0–32)
AST: 24 [IU]/L (ref 0–40)
Albumin: 4.5 g/dL (ref 4.0–5.0)
Alkaline Phosphatase: 34 IU/L — ABNORMAL LOW (ref 44–121)
BUN/Creatinine Ratio: 11 (ref 9–23)
BUN: 8 mg/dL (ref 6–20)
Bilirubin Total: 0.4 mg/dL (ref 0.0–1.2)
CO2: 17 mmol/L — ABNORMAL LOW (ref 20–29)
Calcium: 9.4 mg/dL (ref 8.7–10.2)
Chloride: 104 mmol/L (ref 96–106)
Creatinine, Ser: 0.7 mg/dL (ref 0.57–1.00)
Globulin, Total: 3 g/dL (ref 1.5–4.5)
Glucose: 81 mg/dL (ref 70–99)
Potassium: 4.7 mmol/L (ref 3.5–5.2)
Sodium: 138 mmol/L (ref 134–144)
Total Protein: 7.5 g/dL (ref 6.0–8.5)
eGFR: 126 mL/min/{1.73_m2} (ref >59)

## 2023-04-30 LAB — CBC
Hematocrit: 43.3 % (ref 34.0–46.6)
Hemoglobin: 14.6 g/dL (ref 11.1–15.9)
MCH: 32.4 pg (ref 26.6–33.0)
MCHC: 33.7 g/dL (ref 31.5–35.7)
MCV: 96 fL (ref 79–97)
Platelets: 282 10*3/uL (ref 150–450)
RBC: 4.51 x10E6/uL (ref 3.77–5.28)
RDW: 12.3 % (ref 11.7–15.4)
WBC: 8.7 10*3/uL (ref 3.4–10.8)

## 2023-04-30 LAB — VITAMIN D 25 HYDROXY (VIT D DEFICIENCY, FRACTURES): Vit D, 25-Hydroxy: 10.1 ng/mL — ABNORMAL LOW (ref 30.0–100.0)

## 2023-04-30 MED ORDER — VITAMIN D (ERGOCALCIFEROL) 1.25 MG (50000 UNIT) PO CAPS: 50000.0000 [IU] | ORAL_CAPSULE | ORAL | 1 refills | Status: DC

## 2023-05-01 NOTE — Assessment & Plan Note (Signed)
Likely due to recent Nexplanon insertion Referred to OB/GYN as she has persistent concern

## 2023-05-01 NOTE — Assessment & Plan Note (Signed)
Well-controlled with Lexapro 10 mg QD currently Was likely worse in the past due to workplace related stress

## 2023-05-01 NOTE — Assessment & Plan Note (Addendum)
Last vitamin D Lab Results  Component Value Date   VD25OH 10.1 (L) 04/28/2023   Started vitamin D 50,000 IU QW

## 2023-05-07 ENCOUNTER — Encounter: Payer: Medicaid Other | Admitting: Advanced Practice Midwife

## 2023-05-18 ENCOUNTER — Encounter: Payer: Self-pay | Admitting: Advanced Practice Midwife

## 2023-05-18 ENCOUNTER — Ambulatory Visit: Payer: Medicaid Other | Admitting: Advanced Practice Midwife

## 2023-05-18 VITALS — BP 128/89 | HR 125 | Ht 66.0 in | Wt 269.0 lb

## 2023-05-18 DIAGNOSIS — Z3046 Encounter for surveillance of implantable subdermal contraceptive: Secondary | ICD-10-CM

## 2023-05-18 NOTE — Progress Notes (Unsigned)
HPI:  Emily [name] 22 y.o. here for Nexplanon removal.  Her future plans for birth control are condoms.  Only sexually active on occasion.  Past Medical History: Past Medical History:  Diagnosis Date   Anxiety    Phreesia 03/29/2020   Depression    Phreesia 03/29/2020   Febrile seizure (HCC) 12/2003   Migraines    UTI (urinary tract infection) 12/2003   Renal US WNL    Past Surgical History: History reviewed. No pertinent surgical history.  Family History: Family History  Problem Relation Age of Onset   Liver disease Maternal Grandfather        liver failure   Healthy Father    Healthy Mother     Social History: Social History   Tobacco Use   Smoking status: Never    Passive exposure: Yes   Smokeless tobacco: Never  Vaping Use   Vaping status: Never Used  Substance Use Topics   Alcohol use: Never   Drug use: Never    Allergies: No Known Allergies  Meds: (Not in a hospital admission)     Patient given informed consent for removal of her Nexplanon, time out was performed.  Signed copy in the chart.  Appropriate time out taken. Implanon site identified.  Area prepped in usual sterile fashon. One cc of 1% lidocaine was used to anesthetize the area at the distal end of the implant. A small stab incision was made right beside the implant on the distal portion.  The Nexplanon rod was grasped using hemostats and removed without difficulty.  There was less than 3 cc blood loss. There were no complications.  A small amount of antibiotic ointment and steri-strips were applied over the small incision.  A pressure bandage was applied to reduce any bruising.  The patient tolerated the procedure well and was given post procedure instructions.

## 2023-08-04 ENCOUNTER — Other Ambulatory Visit: Payer: Self-pay | Admitting: Internal Medicine

## 2023-08-04 DIAGNOSIS — F411 Generalized anxiety disorder: Secondary | ICD-10-CM

## 2023-08-26 NOTE — Progress Notes (Unsigned)
 NEUROLOGY FOLLOW UP OFFICE NOTE  Emily [name] [id number]  Assessment/Plan:   Migraine without aura, without status migrainosus, not intractable   Migraine prevention:  Topiramate 100mg  at bedtime *** Migraine rescue:  Ubrelvy 100mg  *** Limit use of pain relievers to no more than 2 days out of week to prevent risk of rebound or medication-overuse headache. Keep headache diary Discussed lifestyle modification Follow up 6-7 months  Subjective:  Emily [name] is 23 year old female with depression, anxiety and history of febrile seizure who follows up for migraines.   UPDATE: Restarted topiramate  Intensity:  moderate Duration:  15 minutes with Emily [name]  Frequency:  4 days a month *** Frequency of abortive medication: no more than 4 days a week Current NSAIDS/analgesics:  none Current triptans: none Current ergotamine:  none Current anti-emetic:  none Current muscle relaxants:  none Current Antihypertensive medications:  none Current Antidepressant medications:  escitalopram 10mg  Current Anticonvulsant medications:  topiramate 100mg  BID Current anti-CGRP:  none Current Vitamins/Herbal/Supplements:  D Current Antihistamines/Decongestants:  loratidine Other therapy:  none Hormone/birth control:  Nexplanon     Caffeine:  Dr. Reino Kent.  No coffee Diet:  30 oz water daily.  Soda (zero-sugar and sugar).  Skips meals Exercise:  no Depression:  stable; Anxiety:  yes Other pain:  no Sleep hygiene:  varies.  Sometimes stays up late and falls asleep at midnight and wakes up at 10 AM   HISTORY:  Onset:  58-42 years old Location:  varies - unilateral (either side), forehead Quality:  stabbing, throbbing Intensity:  severe Aura:  absent Prodrome:  absent Associated symptoms:  nausea, photophobia, phonophobia, osmophobia.  She denies associated vomiting, unilateral numbness or weakness. Duration:  all day Frequency:  2 days a week Frequency of abortive medication: 2 days a  week Triggers:  hormones (improved on Nexplanon), stress, too much sleep, sleep deprivation, hair tied too tight, dairy, sweets, coffee Relieving factors:  sleep, cold rag on forehead, laying in dark Activity:  activity aggravates headache   Past NSAIDS/analgesics:  ibuprofen, naproxen, Excedrin Past abortive triptans:  sumatriptan tab, rizatriptan  Past abortive ergotamine:  none Past muscle relaxants:  none Past anti-emetic:  ondansetron 4mg  Past antihypertensive medications:  none Past antidepressant medications:  none Past anticonvulsant medications:  none Past anti-CGRP: none Past vitamins/Herbal/Supplements:  none Past antihistamines/decongestants:  cetirizine  Other past therapies:  none     Family history of headache:  no  PAST MEDICAL HISTORY: Past Medical History:  Diagnosis Date   Anxiety    Phreesia 03/29/2020   Depression    Phreesia 03/29/2020   Febrile seizure (HCC) 12/2003   Migraines    UTI (urinary tract infection) 12/2003   Renal US WNL    MEDICATIONS: Current Outpatient Medications on File Prior to Visit  Medication Sig Dispense Refill   escitalopram (LEXAPRO) 10 MG tablet Take 1 tablet (10 mg total) by mouth at bedtime. 90 tablet 0   famotidine (PEPCID) 10 MG tablet Take 10 mg by mouth 2 (two) times daily.     loratadine (CLARITIN) 10 MG tablet Take 10 mg by mouth daily.     topiramate (TOPAMAX) 50 MG tablet 2 tablets at bedtime 60 tablet 5   No current facility-administered medications on file prior to visit.    ALLERGIES: No Known Allergies  FAMILY HISTORY: Family History  Problem Relation Age of Onset   Liver disease Maternal Grandfather        liver failure   Healthy Father    Healthy  Mother       Objective:  *** General: No acute distress.  Patient appears ***-groomed.   Head:  Normocephalic/atraumatic Eyes:  Fundi examined but not visualized Neck: supple, no paraspinal tenderness, full range of motion Heart:  Regular rate and  rhythm Lungs:  Clear to auscultation bilaterally Back: No paraspinal tenderness Neurological Exam: alert and oriented.  Speech fluent and not dysarthric, language intact.  CN II-XII intact. Bulk and tone normal, muscle strength 5/5 throughout.  Sensation to light touch intact.  Deep tendon reflexes 2+ throughout, toes downgoing.  Finger to nose testing intact.  Gait normal, Romberg negative.   Shon Millet, DO  CC: ***

## 2023-08-27 ENCOUNTER — Encounter: Payer: Self-pay | Admitting: Neurology

## 2023-08-27 ENCOUNTER — Telehealth: Payer: Self-pay | Admitting: Pharmacist

## 2023-08-27 ENCOUNTER — Ambulatory Visit: Payer: Medicaid Other | Admitting: Neurology

## 2023-08-27 VITALS — BP 120/71 | HR 81 | Ht 65.0 in | Wt 254.0 lb

## 2023-08-27 DIAGNOSIS — G43009 Migraine without aura, not intractable, without status migrainosus: Secondary | ICD-10-CM

## 2023-08-27 MED ORDER — UBRELVY 100 MG PO TABS: 1.0000 | ORAL_TABLET | ORAL | 11 refills | Status: DC | PRN

## 2023-08-27 MED ORDER — TOPIRAMATE 100 MG PO TABS: 100.0000 mg | ORAL_TABLET | Freq: Every day | ORAL | 5 refills | Status: DC

## 2023-08-27 NOTE — Patient Instructions (Signed)
 Continue topiramate 100mg  at bedtime Ubrelvy as needed. Follow up 9 months.

## 2023-08-27 NOTE — Telephone Encounter (Signed)
 Pharmacy Patient Advocate Encounter   Received notification from Physician's Office that prior authorization for Ubrelvy 100MG  tablets is required/requested.   Insurance verification completed.   The patient is insured through [facility] .   Per test claim: PA required; PA submitted to above mentioned insurance via CoverMyMeds Key/confirmation #/EOC [id number] Status is pending

## 2023-08-28 ENCOUNTER — Other Ambulatory Visit (HOSPITAL_COMMUNITY): Payer: Self-pay

## 2023-08-28 NOTE — Telephone Encounter (Signed)
 Pharmacy Patient Advocate Encounter  Received notification from [facility] that Prior Authorization for UBRELVY 100MG  has been DENIED.  Full denial letter will be uploaded to the media tab. See denial reason below.   PA #/Case ID/Reference #:  [id number]

## 2023-09-15 ENCOUNTER — Ambulatory Visit
Admission: EM | Admit: 2023-09-15 | Discharge: 2023-09-15 | Disposition: A | Discharge status: Home / Self Care | Attending: Nurse Practitioner | Admitting: Nurse Practitioner

## 2023-09-15 DIAGNOSIS — J069 Acute upper respiratory infection, unspecified: Secondary | ICD-10-CM | POA: Diagnosis not present

## 2023-09-15 LAB — POC COVID19/FLU A&B COMBO
Covid Antigen, POC: NEGATIVE
Influenza A Antigen, POC: NEGATIVE
Influenza B Antigen, POC: NEGATIVE

## 2023-09-15 MED ORDER — BENZONATATE 100 MG PO CAPS: 100.0000 mg | ORAL_CAPSULE | Freq: Three times a day (TID) | ORAL | 0 refills | Status: DC | PRN

## 2023-09-15 MED ORDER — PROMETHAZINE-DM 6.25-15 MG/5ML PO SYRP: 5.0000 mL | ORAL_SOLUTION | Freq: Every evening | ORAL | 0 refills | Status: DC | PRN

## 2023-09-15 NOTE — ED Provider Notes (Signed)
 [facility]    CSN: [id number] Arrival date & time: 09/15/23  1027      History   Chief Complaint Chief Complaint  Patient presents with   Cough    HPI Emily [name] is a 23 y.o. female.   Patient presents with 1 day history of dry cough, runny and stuffy nose, sore throat, diarrhea, decreased appetite, and fatigue.  No fever, body aches or chills, shortness of breath or chest pain, headache, ear pain, abdominal pain, nausea, or vomiting.  No known sick contacts.  Has taken NyQuil for symptoms with minimal temporary improvement.    Past Medical History:  Diagnosis Date   Anxiety    Phreesia 03/29/2020   Depression    Phreesia 03/29/2020   Febrile seizure (HCC) 12/2003   Migraines    UTI (urinary tract infection) 12/2003   Renal US WNL    Patient Active Problem List   Diagnosis Date Noted   Recurrent major depressive disorder in partial remission (HCC) 09/05/2022   Vitamin D deficiency 03/07/2022   Refused influenza vaccine 03/06/2022   Encounter for general adult medical examination with abnormal findings 03/06/2022   Intractable chronic migraine without aura and without status migrainosus 08/29/2021   Generalized anxiety disorder 08/29/2021   Morbid obesity (HCC) 08/29/2021   Irregular menstrual bleeding 08/29/2021   Seasonal allergies 08/29/2021    History reviewed. No pertinent surgical history.  OB History     Gravida  0   Para  0   Term  0   Preterm  0   AB  0   Living  0      SAB  0   IAB  0   Ectopic  0   Multiple  0   Live Births  0            Home Medications    Prior to Admission medications   Medication Sig Start Date End Date Taking? Authorizing Provider  benzonatate (TESSALON) 100 MG capsule Take 1 capsule (100 mg total) by mouth 3 (three) times daily as needed for cough. Do not take with alcohol or while operating or driving heavy machinery 2/53/66  Yes Cathlean Marseilles A, NP   promethazine-dextromethorphan (PROMETHAZINE-DM) 6.25-15 MG/5ML syrup Take 5 mLs by mouth at bedtime as needed. Do not take with alcohol or while driving or operating heavy machinery.  May cause drowsiness. 09/15/23  Yes Cathlean Marseilles A, NP  escitalopram (LEXAPRO) 20 MG tablet Take 20 mg by mouth at bedtime. 07/28/23   [provider]  famotidine (PEPCID) 10 MG tablet Take 10 mg by mouth 2 (two) times daily.    [provider]  loratadine (CLARITIN) 10 MG tablet Take 10 mg by mouth daily.    [provider]  topiramate (TOPAMAX) 100 MG tablet Take 1 tablet (100 mg total) by mouth at bedtime. 08/27/23   Everlena Cooper, Adam R, DO  Ubrogepant (UBRELVY) 100 MG TABS Take 1 tablet (100 mg total) by mouth as needed. May repeat after 2 hours.  Maximum 2 tablets in 24 hours. 08/27/23   Drema Dallas, DO    Family History Family History  Problem Relation Age of Onset   Liver disease Maternal Grandfather        liver failure   Healthy Father    Healthy Mother     Social History Social History   Tobacco Use   Smoking status: Never    Passive exposure: Yes   Smokeless tobacco: Never  Vaping Use  Vaping status: Never Used  Substance Use Topics   Alcohol use: Never   Drug use: Never     Allergies   Patient has no known allergies.   Review of Systems Review of Systems Per HPI  Physical Exam Triage Vital Signs ED Triage Vitals [09/15/23 0903]  Encounter Vitals Group     BP 104/64     Systolic BP Percentile      Diastolic BP Percentile      Pulse Rate 79     Resp 16     Temp 98.3 F (36.8 C)     Temp Source Oral     SpO2 97 %     Weight      Height      Head Circumference      Peak Flow      Pain Score 4     Pain Loc      Pain Education      Exclude from Growth Chart    No data found.  Updated Vital Signs BP 104/64 (BP Location: Right Arm)   Pulse 79   Temp 98.3 F (36.8 C) (Oral)   Resp 16   LMP  (LMP Unknown)   SpO2 97%   Visual Acuity Right  Eye Distance:   Left Eye Distance:   Bilateral Distance:    Right Eye Near:   Left Eye Near:    Bilateral Near:     Physical Exam Vitals and nursing note reviewed.  Constitutional:      General: She is not in acute distress.    Appearance: Normal appearance. She is not ill-appearing or toxic-appearing.  HENT:     Head: Normocephalic and atraumatic.     Right Ear: Tympanic membrane, ear canal and external ear normal.     Left Ear: Tympanic membrane, ear canal and external ear normal.     Nose: No congestion or rhinorrhea.     Mouth/Throat:     Mouth: Mucous membranes are moist.     Pharynx: Oropharynx is clear. No oropharyngeal exudate or posterior oropharyngeal erythema.  Eyes:     General: No scleral icterus.    Extraocular Movements: Extraocular movements intact.  Cardiovascular:     Rate and Rhythm: Normal rate and regular rhythm.  Pulmonary:     Effort: Pulmonary effort is normal. No respiratory distress.     Breath sounds: Normal breath sounds. No wheezing, rhonchi or rales.  Musculoskeletal:     Cervical back: Normal range of motion and neck supple.  Lymphadenopathy:     Cervical: No cervical adenopathy.  Skin:    General: Skin is warm and dry.     Coloration: Skin is not jaundiced or pale.     Findings: No erythema or rash.  Neurological:     Mental Status: She is alert and oriented to person, place, and time.  Psychiatric:        Behavior: Behavior is cooperative.      UC Treatments / Results  Labs (all labs ordered are listed, but only abnormal results are displayed) Labs Reviewed  POC COVID19/FLU A&B COMBO    EKG   Radiology No results found.  Procedures Procedures (including critical care time)  Medications Ordered in UC Medications - No data to display  Initial Impression / Assessment and Plan / UC Course  I have reviewed the triage vital signs and the nursing notes.  Pertinent labs & imaging results that were available during my care of  the patient were reviewed  by me and considered in my medical decision making (see chart for details).   Patient is well-appearing, normotensive, afebrile, not tachycardic, not tachypneic, oxygenating well on room air.    1. Viral URI with cough Vitals and exam today are reassuring Suspect viral etiology COVID-19 and influenza tests are negative today Supportive care discussed with the patient, start cough suppressant medication ER and return precautions discussed Work excuse provided  The patient was given the opportunity to ask questions.  All questions answered to their satisfaction.  The patient is in agreement to this plan.    Final Clinical Impressions(s) / UC Diagnoses   Final diagnoses:  Viral URI with cough     Discharge Instructions      You have a viral upper respiratory infection.  Symptoms should improve over the next week to 10 days.  If you develop chest pain or shortness of breath, go to the emergency room.  COVID-19 and influenza tests are negative today.  Some things that can make you feel better are: - Increased rest - Increasing fluid with water/sugar free electrolytes - Acetaminophen and ibuprofen as needed for fever/pain - Salt water gargling, chloraseptic spray and throat lozenges - OTC guaifenesin (Mucinex) 600 mg twice daily for congestion - Saline sinus flushes or a neti pot - Humidifying the air -Tessalon Perles every 8 hours as needed for dry cough      ED Prescriptions     Medication Sig Dispense Auth. Provider   benzonatate (TESSALON) 100 MG capsule Take 1 capsule (100 mg total) by mouth 3 (three) times daily as needed for cough. Do not take with alcohol or while operating or driving heavy machinery 21 capsule Cathlean Marseilles A, NP   promethazine-dextromethorphan (PROMETHAZINE-DM) 6.25-15 MG/5ML syrup Take 5 mLs by mouth at bedtime as needed. Do not take with alcohol or while driving or operating heavy machinery.  May cause drowsiness. 118 mL  Valentino Nose, NP      PDMP not reviewed this encounter.   Valentino Nose, NP 09/15/23 1016

## 2023-09-15 NOTE — ED Triage Notes (Signed)
 Pt reports she has a cough and sore throat x 1 day

## 2023-09-15 NOTE — Discharge Instructions (Signed)
 You have a viral upper respiratory infection.  Symptoms should improve over the next week to 10 days.  If you develop chest pain or shortness of breath, go to the emergency room.  COVID-19 and influenza tests are negative today.  Some things that can make you feel better are: - Increased rest - Increasing fluid with water/sugar free electrolytes - Acetaminophen and ibuprofen as needed for fever/pain - Salt water gargling, chloraseptic spray and throat lozenges - OTC guaifenesin (Mucinex) 600 mg twice daily for congestion - Saline sinus flushes or a neti pot - Humidifying the air -Tessalon Perles every 8 hours as needed for dry cough

## 2023-09-16 ENCOUNTER — Other Ambulatory Visit (HOSPITAL_COMMUNITY): Payer: Self-pay

## 2023-09-16 NOTE — Telephone Encounter (Signed)
 Information has been sent to clinical pharmacist for appeals review. It may take 5-7 days to prepare the necessary documentation to request the appeal from the insurance.

## 2023-09-17 ENCOUNTER — Telehealth: Payer: Self-pay | Admitting: Pharmacist

## 2023-09-17 NOTE — Telephone Encounter (Signed)
 Appeal has been submitted for Ubrelvy. Will advise when response is received, please be advised that most companies may take 30 days to make a decision. Appeal letter and supporting documentation have been faxed to [phone] on 09/17/2023 @9 :37am.  Thank you, Dellie Burns, PharmD Clinical Pharmacist  [facility]  Direct Dial: [phone]

## 2023-09-18 NOTE — Telephone Encounter (Signed)
 Appeal has been approved the appeal for Ubrelvy:

## 2023-09-22 ENCOUNTER — Encounter: Payer: Self-pay | Admitting: Neurology

## 2023-10-20 ENCOUNTER — Ambulatory Visit: Admitting: Adult Health

## 2023-10-20 ENCOUNTER — Encounter: Payer: Self-pay | Admitting: Adult Health

## 2023-10-20 ENCOUNTER — Other Ambulatory Visit (HOSPITAL_COMMUNITY)
Admission: RE | Admit: 2023-10-20 | Discharge: 2023-10-20 | Disposition: A | Discharge status: Home / Self Care | Source: Ambulatory Visit | Attending: Adult Health | Admitting: Adult Health

## 2023-10-20 VITALS — BP 114/80 | HR 75 | Ht 67.0 in | Wt 253.0 lb

## 2023-10-20 DIAGNOSIS — Z113 Encounter for screening for infections with a predominantly sexual mode of transmission: Secondary | ICD-10-CM | POA: Diagnosis not present

## 2023-10-20 DIAGNOSIS — Z124 Encounter for screening for malignant neoplasm of cervix: Secondary | ICD-10-CM | POA: Diagnosis present

## 2023-10-20 DIAGNOSIS — Z3202 Encounter for pregnancy test, result negative: Secondary | ICD-10-CM

## 2023-10-20 DIAGNOSIS — N898 Other specified noninflammatory disorders of vagina: Secondary | ICD-10-CM | POA: Diagnosis not present

## 2023-10-20 LAB — POCT URINE PREGNANCY: Preg Test, Ur: NEGATIVE

## 2023-10-20 NOTE — Progress Notes (Signed)
  Subjective:     Patient ID: Emily [name], female   DOB: 05/13/2001, 23 y.o.   MRN: [id number]  HPI Emily [name] is a 23 year old white female,single, G0P0, in complaining of vaginal itching and requests STD testing and needs a pap. She also wants to discuss getting nexplanon  inerted has had before but was removed due to pain in arm.  PCP is Dr Lydia Sams  Review of Systems +vaginal itching  Has recently started having sex Denies MI,stroke, DVT, breast cancer or migraine with aura  Reviewed past medical,surgical, social and family history. Reviewed medications and allergies.     Objective:   Physical Exam BP 114/80 (BP Location: Left Arm, Patient Position: Sitting, Cuff Size: Large)   Pulse 75   Ht 5\' 7"  (1.702 m)   Wt 253 lb (114.8 kg)   LMP 10/19/2023 (Exact Date)   BMI 39.63 kg/m  UPT is negative Skin warm and dry. Lungs: clear to ausculation bilaterally. Cardiovascular: regular rate and rhythm.  Pelvic: external genitalia is normal in appearance no lesions, vagina: +period blood,urethra has no lesions or masses noted, cervix:smooth, pap with GC/CHL and trich performed, uterus: normal size, shape and contour, non tender, no masses felt, adnexa: no masses or tenderness noted. Bladder is non tender and no masses felt.      Upstream - 10/20/23 0858       Pregnancy Intention Screening   Does the patient want to become pregnant in the next year? No    Does the patient's partner want to become pregnant in the next year? No    Would the patient like to discuss contraceptive options today? Yes      Contraception Wrap Up   Current Method Female Condom    End Method Female Condom   will get nexplanon  in near future   Contraception Counseling Provided Yes            Examination chaperoned by Alphonso Aschoff LPN  Assessment:     1. Pregnancy examination or test, negative result - POCT urine pregnancy  2. Routine Papanicolaou smear Pap sent Pap in 3 years if normal  - Cytology - PAP( [facility])  3. Vaginal itching (Primary) Has had vaginal itching Should resolve with period  4. Screening examination for STD (sexually transmitted disease) GC/CHL and trich on pap    Plan:    No sex till after nexplanon  inserted  Return in 2 days for nexplanon  insertion

## 2023-10-21 LAB — CYTOLOGY - PAP
Chlamydia: NEGATIVE
Comment: NEGATIVE
Comment: NEGATIVE
Comment: NORMAL
Diagnosis: NEGATIVE
Neisseria Gonorrhea: NEGATIVE
Trichomonas: NEGATIVE

## 2023-10-22 ENCOUNTER — Encounter: Payer: Self-pay | Admitting: Adult Health

## 2023-10-22 ENCOUNTER — Ambulatory Visit (INDEPENDENT_AMBULATORY_CARE_PROVIDER_SITE_OTHER): Admitting: Adult Health

## 2023-10-22 VITALS — BP 117/76 | HR 74 | Ht 67.0 in | Wt 253.0 lb

## 2023-10-22 DIAGNOSIS — Z3202 Encounter for pregnancy test, result negative: Secondary | ICD-10-CM

## 2023-10-22 DIAGNOSIS — Z30017 Encounter for initial prescription of implantable subdermal contraceptive: Secondary | ICD-10-CM | POA: Insufficient documentation

## 2023-10-22 LAB — POCT URINE PREGNANCY: Preg Test, Ur: NEGATIVE

## 2023-10-22 MED ORDER — ETONOGESTREL 68 MG ~~LOC~~ IMPL
68.0000 mg | DRUG_IMPLANT | Freq: Once | SUBCUTANEOUS | Status: AC
  Administered 2023-10-22: 68 mg via SUBCUTANEOUS

## 2023-10-22 NOTE — Progress Notes (Signed)
  Subjective:     Patient ID: Emily [name], female   DOB: [birth year], 23 y.o.   MRN: [id number]  HPI Emily [name] us  a 23 year old white female, single, G0P0, in for nexplanon  insertion.     Component Value Date/Time   DIAGPAP  10/20/2023 0900    - Negative for intraepithelial lesion or malignancy (NILM)   ADEQPAP  10/20/2023 0900    Satisfactory for evaluation; transformation zone component PRESENT.    PCP is Dr Lydia Sams  Review of Systems For nexplanon  insertion Reviewed past medical,surgical, social and family history. Reviewed medications and allergies.     Objective:   Physical Exam BP 117/76 (BP Location: Right Arm, Patient Position: Sitting, Cuff Size: Large)   Pulse 74   Ht 5\' 7"  (1.702 m)   Wt 253 lb (114.8 kg)   LMP 10/19/2023 (Exact Date)   BMI 39.63 kg/m  UPT is negative Consent signed, time out called. Left arm cleansed with betadine, and injected with 1.5 cc 2% lidocaine and waited til numb. Nexplanon  easily inserted and steri strips applied.Rod easily palpated by provider and pt. Pressure dressing applied.      Upstream - 10/22/23 1135       Pregnancy Intention Screening   Does the patient want to become pregnant in the next year? No    Does the patient's partner want to become pregnant in the next year? No    Would the patient like to discuss contraceptive options today? No      Contraception Wrap Up   Current Method Female Condom    End Method Hormonal Implant    Contraception Counseling Provided Yes             Assessment:     1. Pregnancy examination or test, negative result - POCT urine pregnancy  2. Nexplanon  insertion (Primary) Inserted left arm Lot [id number] 2027-06   Use condoms x 2 weeks, keep clean and dry x 24 hours, no heavy lifting, keep steri strips on x 72 hours, Keep pressure dressing on x 24 hours. Follow up prn problems.  Plan:     Remove nexplanon  in 3 years or sooner if desired

## 2023-10-22 NOTE — Addendum Note (Signed)
 Addended by: Letha Rav on: 10/22/2023 12:32 PM   Modules accepted: Orders

## 2023-10-22 NOTE — Patient Instructions (Signed)
Use condoms x 2 weeks, keep clean and dry x 24 hours, no heavy lifting, keep steri strips on x 72 hours, Keep pressure dressing on x 24 hours. Follow up prn problems.  

## 2023-10-26 ENCOUNTER — Ambulatory Visit: Payer: Medicaid Other | Admitting: Internal Medicine

## 2024-03-02 ENCOUNTER — Other Ambulatory Visit: Payer: Self-pay | Admitting: Neurology

## 2024-04-28 NOTE — Progress Notes (Unsigned)
 NEUROLOGY FOLLOW UP OFFICE NOTE  Emily [name] [id number]  Assessment/Plan:   Migraine without aura, without status migrainosus, not intractable    Migraine prevention:  Topiramate  100mg  at bedtime *** Migraine rescue:  Ubrelvy  100mg  *** Lifestyle modification: Limit use of pain relievers to no more than 9 days out of the month to prevent risk of rebound or medication-overuse headache. Diet modification/hydration/caffeine cessation Routine exercise Sleep hygiene Consider vitamins/supplements:  magnesium citrate 400mg  daily, riboflavin 400mg  daily, CoQ10 100mg  three times daily Keep headache diary Follow up 1 year ***   Subjective:  Emily [name] is 23 year old female with depression, anxiety and history of febrile seizure who follows up for migraines.   UPDATE: *** Intensity:  moderate Duration:  15 minutes with Ubrelvy   Frequency:  3 times a month Frequency of abortive medication: no more than 4 days a week Current NSAIDS/analgesics:  none Current triptans: none Current ergotamine:  none Current anti-emetic:  none Current muscle relaxants:  none Current Antihypertensive medications:  none Current Antidepressant medications:  escitalopram  10mg  Current Anticonvulsant medications:  topiramate  100mg  at bedtime Current anti-CGRP:  none Current Vitamins/Herbal/Supplements:  D Current Antihistamines/Decongestants:  loratidine Other therapy:  none Hormone/birth control:  Nexplanon      Caffeine:  Cut down on caffeine Diet:  30 oz water daily.  Soda (zero-sugar and sugar).  Cut down on dairy. Skips meals at work.  Eats at home - one meal a day.  Does drink water at work.   Exercise:  after work, walks the dogs Depression:  stable; Anxiety:  yes Other pain:  no Sleep hygiene:  varies.  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  She denies associated vomiting, unilateral numbness or weakness. Duration:  all day Frequency:  2 days a week Frequency of abortive medication: 2 days a week Triggers:  hormones (improved on Nexplanon ), stress, too much sleep, sleep deprivation, hair tied too tight, dairy, sweets, coffee Relieving factors:  sleep, cold rag on forehead, laying in dark Activity:  activity aggravates headache   Past NSAIDS/analgesics:  ibuprofen, naproxen, Excedrin Past abortive triptans:  sumatriptan  tab, rizatriptan   Past abortive ergotamine:  none Past muscle relaxants:  none Past anti-emetic:  ondansetron  4mg  Past antihypertensive medications:  none Past antidepressant medications:  none Past anticonvulsant medications:  none Past anti-CGRP: none Past vitamins/Herbal/Supplements:  none Past antihistamines/decongestants:  cetirizine   Other past therapies:  none     Family history of headache:  no  PAST MEDICAL HISTORY: Past Medical History:  Diagnosis Date   Anxiety    Phreesia 03/29/2020   Depression    Phreesia 03/29/2020   Febrile seizure (HCC) 12/2003   Migraines    UTI (urinary tract infection) 12/2003   Renal US  WNL    MEDICATIONS: Current Outpatient Medications on File Prior to Visit  Medication Sig Dispense Refill   escitalopram  (LEXAPRO ) 20 MG tablet Take 20 mg by mouth at bedtime.     famotidine (PEPCID) 10 MG tablet Take 10 mg by mouth 2 (two) times daily.     loratadine (CLARITIN) 10 MG tablet Take 10 mg by mouth daily.     topiramate  (TOPAMAX ) 100 MG tablet Take 1 tablet (100 mg total) by mouth  at bedtime. 30 tablet 2   No current facility-administered medications on file prior to visit.    ALLERGIES: No Known Allergies  FAMILY HISTORY: Family History  Problem Relation Age of Onset   Liver disease Maternal  Grandfather        liver failure   Healthy Father    Healthy Mother       Objective:  *** General: No acute distress.  Patient appears well-groomed.   Head:  Normocephalic/atraumatic Neck:  Supple.  No paraspinal tenderness.  Full range of motion. Heart:  Regular rate and rhythm. Neuro:  Alert and oriented.  Speech fluent and not dysarthric.  Language intact.  CN II-XII intact.  Bulk and tone normal.  Muscle strength 5/5 throughout.  Sensation to light touch intact.  Deep tendon reflexes 2+ throughout, toes downgoing.  Gait normal.  Romberg negative.    Juliene Dunnings, DO  CC: Suzzane Blanch, MD

## 2024-04-29 ENCOUNTER — Ambulatory Visit (INDEPENDENT_AMBULATORY_CARE_PROVIDER_SITE_OTHER): Admitting: Neurology

## 2024-04-29 ENCOUNTER — Encounter: Payer: Self-pay | Admitting: Neurology

## 2024-04-29 VITALS — BP 118/78 | HR 74 | Ht 66.0 in | Wt 253.2 lb

## 2024-04-29 DIAGNOSIS — G43009 Migraine without aura, not intractable, without status migrainosus: Secondary | ICD-10-CM

## 2024-04-29 MED ORDER — TOPIRAMATE 100 MG PO TABS: 100.0000 mg | ORAL_TABLET | Freq: Every day | ORAL | 11 refills | Status: AC

## 2024-04-29 NOTE — Patient Instructions (Signed)
 Continue topiramate  100mg  at bedtime At earliest onset of migraine, take Nurtec (one tablet in 24 hours).  Let me know if it works Limit use of pain relievers to no more than 9 days out of the month to prevent risk of rebound or medication-overuse headache. Keep headache diary

## 2024-05-02 ENCOUNTER — Ambulatory Visit: Admitting: Neurology

## 2024-05-05 ENCOUNTER — Ambulatory Visit: Admitting: Neurology

## 2024-05-18 ENCOUNTER — Ambulatory Visit
Admission: EM | Admit: 2024-05-18 | Discharge: 2024-05-18 | Disposition: A | Discharge status: Home / Self Care | Attending: Nurse Practitioner | Admitting: Nurse Practitioner

## 2024-05-18 DIAGNOSIS — K5900 Constipation, unspecified: Secondary | ICD-10-CM | POA: Diagnosis not present

## 2024-05-18 DIAGNOSIS — G43801 Other migraine, not intractable, with status migrainosus: Secondary | ICD-10-CM | POA: Diagnosis not present

## 2024-05-18 DIAGNOSIS — R109 Unspecified abdominal pain: Secondary | ICD-10-CM | POA: Diagnosis not present

## 2024-05-18 LAB — POCT URINE DIPSTICK
Bilirubin, UA: NEGATIVE
Blood, UA: NEGATIVE
Glucose, UA: NEGATIVE mg/dL
Ketones, POC UA: NEGATIVE mg/dL
Leukocytes, UA: NEGATIVE
Nitrite, UA: NEGATIVE
POC PROTEIN,UA: NEGATIVE
Spec Grav, UA: 1.02 (ref 1.010–1.025)
Urobilinogen, UA: 0.2 U/dL
pH, UA: 7.5 (ref 5.0–8.0)

## 2024-05-18 LAB — GLUCOSE, POCT (MANUAL RESULT ENTRY)

## 2024-05-18 LAB — POCT URINE PREGNANCY: Preg Test, Ur: NEGATIVE

## 2024-05-18 MED ORDER — SENNOSIDES-DOCUSATE SODIUM 8.6-50 MG PO TABS: 1.0000 | ORAL_TABLET | Freq: Every day | ORAL | 0 refills | Status: AC

## 2024-05-18 MED ORDER — DEXAMETHASONE SOD PHOSPHATE PF 10 MG/ML IJ SOLN
10.0000 mg | Freq: Once | INTRAMUSCULAR | Status: AC
  Administered 2024-05-18: 10 mg via INTRAMUSCULAR

## 2024-05-18 MED ORDER — KETOROLAC TROMETHAMINE 30 MG/ML IJ SOLN
30.0000 mg | Freq: Once | INTRAMUSCULAR | Status: AC
  Administered 2024-05-18: 30 mg via INTRAMUSCULAR

## 2024-05-18 MED ORDER — ONDANSETRON 4 MG PO TBDP: 4.0000 mg | ORAL_TABLET | Freq: Once | ORAL | Status: DC

## 2024-05-18 MED ORDER — POLYETHYLENE GLYCOL 3350 17 G PO PACK: 17.0000 g | PACK | Freq: Two times a day (BID) | ORAL | 0 refills | Status: AC | PRN

## 2024-05-18 NOTE — ED Triage Notes (Signed)
 Headache, abdominal pain x 2 days, constipation x 5 days. Pt states she was able to have a small BM this morning. Tried otc stool softener an laxative and prune juice.

## 2024-05-18 NOTE — Discharge Instructions (Addendum)
 You were given an injection of Decadron  10 mg and Toradol  30 mg.  Do not take any additional NSAIDs today to include ibuprofen, Aleve, Motrin, naproxen, or Advil.  You may take Tylenol for pain or discomfort. The urinalysis and pregnancy test were negative. Take medication as prescribed. You may take over-the-counter Tylenol as needed for pain, fever, or general discomfort. Recommend dietary changes to help improve your headache symptoms and constipation.   Go to the emergency department if you experience worsening abdominal pain, or develop new symptoms of fever, chills, nausea, vomiting, or other concerns. Follow-up with your neurologist if symptoms fail to improve. Follow-up as needed.

## 2024-05-18 NOTE — ED Provider Notes (Signed)
 [facility]    CSN: [id number] Arrival date & time: 05/18/24  9060      History   Chief Complaint No chief complaint on file.   HPI Emily [name] is a 23 y.o. female.   The history is provided by the patient.   Patient presents for complaints of headache, abdominal pain, and constipation.  Patient states headache started this morning, with the constipation starting 5 days ago and abdominal pain starting 2 days ago.  She states that she did have some intermittent nausea, but that has since improved.  States that she usually does not have a problem having bowel movements.  States that her last bowel movement was this morning, but it was very little.  States that she has also had some gas and bloating.  With regard to her headache, she states that she does have underlying history of migraines.  Last headache was approximately 1 week ago.  Denies an aura.  States that she does see neurology, states that she was started on topiramate  recently and that has improved her headaches.  She states that her diet is not consistent, states that dairy and caffeine are some of her triggers along with yeast.  States that she has had some of these triggers over the past day or so.  Patient denies fever, chills, ear pain, ear drainage, lightheadedness, dizziness, vomiting, or urinary symptoms.  Patient states that she did try an over-the-counter stool softener and laxative and prune juice for her constipation with minimal relief.  Past Medical History:  Diagnosis Date   Anxiety    Phreesia 03/29/2020   Depression    Phreesia 03/29/2020   Febrile seizure (HCC) 12/2003   Migraines    UTI (urinary tract infection) 12/2003   Renal US  WNL    Patient Active Problem List   Diagnosis Date Noted   Nexplanon  insertion 10/22/2023   Vaginal itching 10/20/2023   Routine Papanicolaou smear 10/20/2023   Pregnancy examination or test, negative result 10/20/2023   Screening examination for STD  (sexually transmitted disease) 10/20/2023   Recurrent major depressive disorder in partial remission 09/05/2022   Vitamin D  deficiency 03/07/2022   Refused influenza vaccine 03/06/2022   Encounter for general adult medical examination with abnormal findings 03/06/2022   Intractable chronic migraine without aura and without status migrainosus 08/29/2021   Generalized anxiety disorder 08/29/2021   Morbid obesity (HCC) 08/29/2021   Irregular menstrual bleeding 08/29/2021   Seasonal allergies 08/29/2021    History reviewed. No pertinent surgical history.  OB History     Gravida  0   Para  0   Term  0   Preterm  0   AB  0   Living  0      SAB  0   IAB  0   Ectopic  0   Multiple  0   Live Births  0            Home Medications    Prior to Admission medications   Medication Sig Start Date End Date Taking? Authorizing Provider  famotidine (PEPCID) 10 MG tablet Take 10 mg by mouth 2 (two) times daily.   Yes [provider]  topiramate  (TOPAMAX ) 100 MG tablet Take 1 tablet (100 mg total) by mouth at bedtime. 04/29/24  Yes Jaffe, Adam R, DO  escitalopram  (LEXAPRO ) 20 MG tablet Take 20 mg by mouth at bedtime. Patient not taking: Reported on 04/29/2024 07/28/23   [provider]  loratadine (CLARITIN) 10 MG  tablet Take 10 mg by mouth daily.    [provider]    Family History Family History  Problem Relation Age of Onset   Liver disease Maternal Grandfather        liver failure   Healthy Father    Healthy Mother     Social History Social History   Tobacco Use   Smoking status: Never    Passive exposure: Yes   Smokeless tobacco: Never  Vaping Use   Vaping status: Never Used  Substance Use Topics   Alcohol use: Never   Drug use: Never     Allergies   Patient has no known allergies.   Review of Systems Review of Systems Per HPI  Physical Exam Triage Vital Signs ED Triage Vitals  Encounter Vitals Group     BP 05/18/24  0950 (!) 123/93     Girls Systolic BP Percentile --      Girls Diastolic BP Percentile --      Boys Systolic BP Percentile --      Boys Diastolic BP Percentile --      Pulse Rate 05/18/24 0950 80     Resp 05/18/24 0950 16     Temp 05/18/24 0950 98.3 F (36.8 C)     Temp Source 05/18/24 0950 Oral     SpO2 05/18/24 0950 97 %     Weight --      Height --      Head Circumference --      Peak Flow --      Pain Score 05/18/24 0953 5     Pain Loc --      Pain Education --      Exclude from Growth Chart --    No data found.  Updated Vital Signs BP (!) 123/93 (BP Location: Right Arm)   Pulse 80   Temp 98.3 F (36.8 C) (Oral)   Resp 16   LMP 04/26/2024 (Approximate)   SpO2 97%   Visual Acuity Right Eye Distance:   Left Eye Distance:   Bilateral Distance:    Right Eye Near:   Left Eye Near:    Bilateral Near:     Physical Exam Vitals and nursing note reviewed.  Constitutional:      General: She is not in acute distress.    Appearance: Normal appearance.  HENT:     Head: Normocephalic.     Right Ear: Tympanic membrane, ear canal and external ear normal.     Left Ear: Tympanic membrane, ear canal and external ear normal.     Nose: Nose normal.     Mouth/Throat:     Mouth: Mucous membranes are moist.  Eyes:     Extraocular Movements: Extraocular movements intact.     Pupils: Pupils are equal, round, and reactive to light.  Cardiovascular:     Rate and Rhythm: Normal rate and regular rhythm.     Pulses: Normal pulses.     Heart sounds: Normal heart sounds.  Pulmonary:     Effort: Pulmonary effort is normal. No respiratory distress.     Breath sounds: Normal breath sounds. No stridor. No wheezing, rhonchi or rales.  Abdominal:     General: Bowel sounds are normal.     Palpations: Abdomen is soft.     Tenderness: There is abdominal tenderness.  Musculoskeletal:     Cervical back: Normal range of motion.  Skin:    General: Skin is warm and dry.  Neurological:  General: No focal deficit present.     Mental Status: She is alert and oriented to person, place, and time.     GCS: GCS eye subscore is 4. GCS verbal subscore is 5. GCS motor subscore is 6.     Cranial Nerves: Cranial nerves 2-12 are intact.     Sensory: Sensation is intact.     Motor: Motor function is intact.     Coordination: Coordination is intact.     Gait: Gait is intact.  Psychiatric:        Mood and Affect: Mood normal.        Behavior: Behavior normal.      UC Treatments / Results  Labs (all labs ordered are listed, but only abnormal results are displayed) Labs Reviewed  POCT URINE DIPSTICK  POCT URINE PREGNANCY    EKG   Radiology No results found.  Procedures Procedures (including critical care time)  Medications Ordered in UC Medications - No data to display  Initial Impression / Assessment and Plan / UC Course  I have reviewed the triage vital signs and the nursing notes.  Pertinent labs & imaging results that were available during my care of the patient were reviewed by me and considered in my medical decision making (see chart for details).  Patient presents for complaints of abdominal pain, constipation, and headache.  On exam, the patient's lung sounds are clear throughout, room air sats are at 97%.  She does have generalized abdominal tenderness; however, her vital signs are stable, and she does not exhibit any rebound tenderness or guarding.  Symptoms are not consistent with acute abdomen at this time.  With regard to her headache, she does have underlying history of migraines, admits that she needs to make dietary modifications to help decrease triggers.  With regard to her headache, Decadron  10 mg IM and Toradol  30 mg IM were administered for headache pain.  Based on the patient's presentation, abdominal pain is related to her constipation, will treat with MiraLAX  17 g and Senokot 8.6/50 mg tablets.  Supportive care recommendations were provided and  discussed with the patient to include over-the-counter analgesics, increasing her fluid intake, dietary modifications for both constipation and headache, along with strict ER follow-up precautions.  Patient advised to follow-up with neurology if symptoms fail to improve.  Patient was in agreement with this plan of care and verbalizes understanding.  All questions were answered.  Patient stable for discharge.  Work note was provided.   Final Clinical Impressions(s) / UC Diagnoses   Final diagnoses:  Abdominal pain, unspecified abdominal location   Discharge Instructions   None    ED Prescriptions   None    PDMP not reviewed this encounter.   Gilmer Etta PARAS, NP 05/18/24 1036

## 2024-06-15 ENCOUNTER — Ambulatory Visit
Admission: EM | Admit: 2024-06-15 | Discharge: 2024-06-15 | Disposition: A | Discharge status: Home / Self Care | Attending: Nurse Practitioner | Admitting: Nurse Practitioner

## 2024-06-15 ENCOUNTER — Other Ambulatory Visit: Payer: Self-pay

## 2024-06-15 ENCOUNTER — Encounter: Payer: Self-pay | Admitting: Emergency Medicine

## 2024-06-15 DIAGNOSIS — J309 Allergic rhinitis, unspecified: Secondary | ICD-10-CM

## 2024-06-15 DIAGNOSIS — R059 Cough, unspecified: Secondary | ICD-10-CM | POA: Diagnosis not present

## 2024-06-15 DIAGNOSIS — Z8709 Personal history of other diseases of the respiratory system: Secondary | ICD-10-CM

## 2024-06-15 MED ORDER — AZELASTINE HCL 0.1 % NA SOLN: 2.0000 | Freq: Two times a day (BID) | NASAL | 0 refills | Status: AC

## 2024-06-15 MED ORDER — CETIRIZINE HCL 10 MG PO TABS: 10.0000 mg | ORAL_TABLET | Freq: Every day | ORAL | 0 refills | Status: AC

## 2024-06-15 MED ORDER — PSEUDOEPH-BROMPHEN-DM 30-2-10 MG/5ML PO SYRP: 5.0000 mL | ORAL_SOLUTION | Freq: Four times a day (QID) | ORAL | 0 refills | Status: AC | PRN

## 2024-06-15 NOTE — Discharge Instructions (Addendum)
 Take medication as prescribed. Increase fluids and allow for plenty of rest. Recommend over-the-counter Tylenol or ibuprofen as needed for pain, fever, or general discomfort. You may use normal saline nasal spray throughout the day for nasal congestion or runny nose. For your cough, recommend the use of a humidifier in your bedroom at nighttime during sleep and to sleep elevated on pillows while symptoms persist. If your symptoms fail to improve, recommend follow-up in this clinic or with your primary care physician for further evaluation. Follow-up as needed.

## 2024-06-15 NOTE — ED Triage Notes (Addendum)
 Pt reports intermittent cough, headache, runny nose x2 weeks. Denies any known fevers. Has tried otc medication and reports some relief of discomfort but states it keeps coming back.

## 2024-06-15 NOTE — ED Provider Notes (Signed)
 " [facility]    CSN: [id number] Arrival date & time: 06/15/24  9188      History   Chief Complaint Chief Complaint  Patient presents with   Cough    HPI Emily [name] is a 23 y.o. female.   The history is provided by the patient.   Patient presents with a 2-week history of intermittent cough, headache, and runny nose.  Patient reports underlying history of seasonal allergies, states she has not taken any medications for allergies at this time.  She denies fever, chills, ear pain, ear drainage, wheezing, difficulty breathing, chest pain, abdominal pain, nausea, vomiting, diarrhea, or rash.  Patient states it keeps coming back.  States that she has been taking over-the-counter cough and cold medications for her symptoms with minimal relief.  Past Medical History:  Diagnosis Date   Anxiety    Phreesia 03/29/2020   Depression    Phreesia 03/29/2020   Febrile seizure (HCC) 12/2003   Migraines    UTI (urinary tract infection) 12/2003   Renal US  WNL    Patient Active Problem List   Diagnosis Date Noted   Nexplanon  insertion 10/22/2023   Vaginal itching 10/20/2023   Routine Papanicolaou smear 10/20/2023   Pregnancy examination or test, negative result 10/20/2023   Screening examination for STD (sexually transmitted disease) 10/20/2023   Recurrent major depressive disorder in partial remission 09/05/2022   Vitamin D  deficiency 03/07/2022   Refused influenza vaccine 03/06/2022   Encounter for general adult medical examination with abnormal findings 03/06/2022   Intractable chronic migraine without aura and without status migrainosus 08/29/2021   Generalized anxiety disorder 08/29/2021   Morbid obesity (HCC) 08/29/2021   Irregular menstrual bleeding 08/29/2021   Seasonal allergies 08/29/2021    History reviewed. No pertinent surgical history.  OB History     Gravida  0   Para  0   Term  0   Preterm  0   AB  0   Living  0      SAB  0   IAB   0   Ectopic  0   Multiple  0   Live Births  0            Home Medications    Prior to Admission medications  Medication Sig Start Date End Date Taking? Authorizing Provider  azelastine  (ASTELIN ) 0.1 % nasal spray Place 2 sprays into both nostrils 2 (two) times daily. Use in each nostril as directed 06/15/24  Yes Leath-Warren, Etta PARAS, NP  brompheniramine-pseudoephedrine-DM 30-2-10 MG/5ML syrup Take 5 mLs by mouth 4 (four) times daily as needed. 06/15/24  Yes Leath-Warren, Etta PARAS, NP  cetirizine  (ZYRTEC ) 10 MG tablet Take 1 tablet (10 mg total) by mouth daily. 06/15/24  Yes Leath-Warren, Etta PARAS, NP  escitalopram  (LEXAPRO ) 20 MG tablet Take 20 mg by mouth at bedtime. Patient not taking: Reported on 04/29/2024 07/28/23   [provider]  famotidine (PEPCID) 10 MG tablet Take 10 mg by mouth 2 (two) times daily.    [provider]  loratadine (CLARITIN) 10 MG tablet Take 10 mg by mouth daily.    [provider]  polyethylene glycol (MIRALAX ) 17 g packet Take 17 g by mouth 2 (two) times daily as needed. 05/18/24   Leath-Warren, Etta PARAS, NP  senna-docusate (SENOKOT-S) 8.6-50 MG tablet Take 1 tablet by mouth daily. 05/18/24   Leath-Warren, Etta PARAS, NP  topiramate  (TOPAMAX ) 100 MG tablet Take 1 tablet (100 mg total) by mouth at bedtime.  04/29/24   Skeet Juliene SAUNDERS, DO    Family History Family History  Problem Relation Age of Onset   Liver disease Maternal Grandfather        liver failure   Healthy Father    Healthy Mother     Social History Social History[1]   Allergies   Patient has no known allergies.   Review of Systems Review of Systems Per HPI  Physical Exam Triage Vital Signs ED Triage Vitals  Encounter Vitals Group     BP 06/15/24 0933 102/61     Girls Systolic BP Percentile --      Girls Diastolic BP Percentile --      Boys Systolic BP Percentile --      Boys Diastolic BP Percentile --      Pulse Rate 06/15/24 0933 83      Resp 06/15/24 0933 18     Temp 06/15/24 0933 98.6 F (37 C)     Temp Source 06/15/24 0933 Oral     SpO2 06/15/24 0933 96 %     Weight --      Height --      Head Circumference --      Peak Flow --      Pain Score 06/15/24 0934 5     Pain Loc --      Pain Education --      Exclude from Growth Chart --    No data found.  Updated Vital Signs BP 102/61 (BP Location: Right Arm)   Pulse 83   Temp 98.6 F (37 C) (Oral)   Resp 18   LMP 06/12/2024 (Approximate)   SpO2 96%   Visual Acuity Right Eye Distance:   Left Eye Distance:   Bilateral Distance:    Right Eye Near:   Left Eye Near:    Bilateral Near:     Physical Exam Vitals and nursing note reviewed.  Constitutional:      General: She is not in acute distress.    Appearance: Normal appearance.  HENT:     Head: Normocephalic.     Right Ear: Tympanic membrane, ear canal and external ear normal.     Left Ear: Tympanic membrane, ear canal and external ear normal.     Nose: Rhinorrhea present.     Right Turbinates: Enlarged and swollen.     Left Turbinates: Enlarged and swollen.     Right Sinus: No maxillary sinus tenderness or frontal sinus tenderness.     Left Sinus: No maxillary sinus tenderness or frontal sinus tenderness.     Mouth/Throat:     Lips: Pink.     Mouth: Mucous membranes are moist.     Pharynx: Uvula midline. Postnasal drip present. No pharyngeal swelling, oropharyngeal exudate, posterior oropharyngeal erythema or uvula swelling.     Comments: Cobblestoning present to posterior oropharynx  Eyes:     Extraocular Movements: Extraocular movements intact.     Conjunctiva/sclera: Conjunctivae normal.     Pupils: Pupils are equal, round, and reactive to light.  Cardiovascular:     Rate and Rhythm: Normal rate and regular rhythm.     Pulses: Normal pulses.     Heart sounds: Normal heart sounds.  Pulmonary:     Effort: Pulmonary effort is normal. No respiratory distress.     Breath sounds: Normal breath  sounds. No stridor. No wheezing, rhonchi or rales.  Abdominal:     General: Bowel sounds are normal.     Palpations: Abdomen is soft.  Musculoskeletal:     Cervical back: Normal range of motion.  Skin:    General: Skin is warm and dry.  Neurological:     General: No focal deficit present.     Mental Status: She is alert and oriented to person, place, and time.  Psychiatric:        Mood and Affect: Mood normal.        Behavior: Behavior normal.      UC Treatments / Results  Labs (all labs ordered are listed, but only abnormal results are displayed) Labs Reviewed - No data to display  EKG   Radiology No results found.  Procedures Procedures (including critical care time)  Medications Ordered in UC Medications - No data to display  Initial Impression / Assessment and Plan / UC Course  I have reviewed the triage vital signs and the nursing notes.  Pertinent labs & imaging results that were available during my care of the patient were reviewed by me and considered in my medical decision making (see chart for details).  On exam, the patient's lung sounds are clear throughout, room air sats are at 96%.  Patient reports for complaints of intermittent headache, cough, and runny nose.  Symptoms are consistent with allergic rhinitis given her history of the same.  Will provide symptomatic treatment with Bromfed-DM, cetirizine  10 mg, and azelastine  nasal spray.  Supportive care recommendations were provided and discussed with the patient to include fluids, rest, over-the-counter analgesics, and use of normal saline nasal spray.  Discussed indications with the patient regarding follow-up.  Patient was in agreement with this plan of care and verbalizes understanding.  All questions were answered.  Patient stable for discharge. Work note was provided.   Final Clinical Impressions(s) / UC Diagnoses   Final diagnoses:  Allergic rhinitis, unspecified seasonality, unspecified trigger   Cough, unspecified type     Discharge Instructions      Take medication as prescribed. Increase fluids and allow for plenty of rest. Recommend over-the-counter Tylenol or ibuprofen as needed for pain, fever, or general discomfort. You may use normal saline nasal spray throughout the day for nasal congestion or runny nose. For your cough, recommend the use of a humidifier in your bedroom at nighttime during sleep and to sleep elevated on pillows while symptoms persist. If your symptoms fail to improve, recommend follow-up in this clinic or with your primary care physician for further evaluation. Follow-up as needed.     ED Prescriptions     Medication Sig Dispense Auth. Provider   brompheniramine-pseudoephedrine-DM 30-2-10 MG/5ML syrup Take 5 mLs by mouth 4 (four) times daily as needed. 140 mL Leath-Warren, Etta PARAS, NP   azelastine  (ASTELIN ) 0.1 % nasal spray Place 2 sprays into both nostrils 2 (two) times daily. Use in each nostril as directed 30 mL Leath-Warren, Etta PARAS, NP   cetirizine  (ZYRTEC ) 10 MG tablet Take 1 tablet (10 mg total) by mouth daily. 30 tablet Leath-Warren, Etta PARAS, NP      PDMP not reviewed this encounter.    [1]  Social History Tobacco Use   Smoking status: Never    Passive exposure: Yes   Smokeless tobacco: Never  Vaping Use   Vaping status: Never Used  Substance Use Topics   Alcohol use: Never   Drug use: Never     Gilmer Etta PARAS, NP 06/15/24 [phone]  "

## 2025-05-12 ENCOUNTER — Ambulatory Visit: Admitting: Neurology
